# Patient Record
Sex: Female | Born: 1950
Health system: Southern US, Community
[De-identification: ages and names within clinical notes are randomized; demographics above are authoritative.]

## PROBLEM LIST (undated history)

## (undated) DIAGNOSIS — I471 Supraventricular tachycardia, unspecified: Secondary | ICD-10-CM

## (undated) DIAGNOSIS — I4891 Unspecified atrial fibrillation: Secondary | ICD-10-CM

## (undated) DIAGNOSIS — E785 Hyperlipidemia, unspecified: Secondary | ICD-10-CM

## (undated) DIAGNOSIS — T8859XA Other complications of anesthesia, initial encounter: Secondary | ICD-10-CM

## (undated) DIAGNOSIS — I499 Cardiac arrhythmia, unspecified: Secondary | ICD-10-CM

## (undated) DIAGNOSIS — R0789 Other chest pain: Secondary | ICD-10-CM

## (undated) DIAGNOSIS — D649 Anemia, unspecified: Secondary | ICD-10-CM

## (undated) DIAGNOSIS — G4730 Sleep apnea, unspecified: Secondary | ICD-10-CM

## (undated) DIAGNOSIS — I10 Essential (primary) hypertension: Secondary | ICD-10-CM

## (undated) DIAGNOSIS — K219 Gastro-esophageal reflux disease without esophagitis: Secondary | ICD-10-CM

## (undated) DIAGNOSIS — T4145XA Adverse effect of unspecified anesthetic, initial encounter: Secondary | ICD-10-CM

## (undated) DIAGNOSIS — R222 Localized swelling, mass and lump, trunk: Secondary | ICD-10-CM

## (undated) DIAGNOSIS — M1990 Unspecified osteoarthritis, unspecified site: Secondary | ICD-10-CM

## (undated) HISTORY — DX: Other chest pain: R07.89

## (undated) HISTORY — DX: Unspecified osteoarthritis, unspecified site: M19.90

## (undated) HISTORY — DX: Anemia, unspecified: D64.9

## (undated) HISTORY — DX: Localized swelling, mass and lump, trunk: R22.2

## (undated) HISTORY — PX: ABDOMINAL HYSTERECTOMY: SHX81

## (undated) HISTORY — DX: Hyperlipidemia, unspecified: E78.5

## (undated) HISTORY — DX: Supraventricular tachycardia, unspecified: I47.10

## (undated) HISTORY — PX: COLONOSCOPY: SHX174

## (undated) HISTORY — DX: Supraventricular tachycardia: I47.1

---

## 1988-05-25 HISTORY — PX: CHOLECYSTECTOMY: SHX55

## 1998-08-28 ENCOUNTER — Other Ambulatory Visit: Admission: RE | Admit: 1998-08-28 | Discharge: 1998-08-28 | Payer: Self-pay | Admitting: Obstetrics and Gynecology

## 2000-05-13 ENCOUNTER — Other Ambulatory Visit: Admission: RE | Admit: 2000-05-13 | Discharge: 2000-05-13 | Payer: Self-pay | Admitting: Obstetrics and Gynecology

## 2000-07-26 ENCOUNTER — Ambulatory Visit (HOSPITAL_BASED_OUTPATIENT_CLINIC_OR_DEPARTMENT_OTHER): Admission: RE | Admit: 2000-07-26 | Discharge: 2000-07-26 | Payer: Self-pay | Admitting: Obstetrics and Gynecology

## 2000-07-27 ENCOUNTER — Ambulatory Visit (HOSPITAL_BASED_OUTPATIENT_CLINIC_OR_DEPARTMENT_OTHER): Admission: RE | Admit: 2000-07-27 | Discharge: 2000-07-27 | Payer: Self-pay | Admitting: Obstetrics and Gynecology

## 2001-03-05 ENCOUNTER — Emergency Department (HOSPITAL_COMMUNITY): Admission: EM | Admit: 2001-03-05 | Discharge: 2001-03-05 | Payer: Self-pay | Admitting: Emergency Medicine

## 2001-05-31 ENCOUNTER — Emergency Department (HOSPITAL_COMMUNITY): Admission: EM | Admit: 2001-05-31 | Discharge: 2001-05-31 | Payer: Self-pay | Admitting: Emergency Medicine

## 2001-05-31 ENCOUNTER — Encounter: Payer: Self-pay | Admitting: Emergency Medicine

## 2001-09-06 ENCOUNTER — Other Ambulatory Visit: Admission: RE | Admit: 2001-09-06 | Discharge: 2001-09-06 | Payer: Self-pay | Admitting: Obstetrics and Gynecology

## 2003-03-06 ENCOUNTER — Other Ambulatory Visit: Admission: RE | Admit: 2003-03-06 | Discharge: 2003-03-06 | Payer: Self-pay | Admitting: Obstetrics and Gynecology

## 2003-05-30 ENCOUNTER — Emergency Department (HOSPITAL_COMMUNITY): Admission: EM | Admit: 2003-05-30 | Discharge: 2003-05-31 | Payer: Self-pay | Admitting: Emergency Medicine

## 2003-05-31 ENCOUNTER — Ambulatory Visit (HOSPITAL_COMMUNITY): Admission: RE | Admit: 2003-05-31 | Discharge: 2003-05-31 | Payer: Self-pay | Admitting: Pulmonary Disease

## 2003-06-04 ENCOUNTER — Ambulatory Visit (HOSPITAL_COMMUNITY): Admission: RE | Admit: 2003-06-04 | Discharge: 2003-06-04 | Payer: Self-pay | Admitting: *Deleted

## 2003-06-09 ENCOUNTER — Emergency Department (HOSPITAL_COMMUNITY): Admission: EM | Admit: 2003-06-09 | Discharge: 2003-06-10 | Payer: Self-pay | Admitting: Internal Medicine

## 2004-02-17 ENCOUNTER — Ambulatory Visit: Admission: RE | Admit: 2004-02-17 | Discharge: 2004-02-17 | Payer: Self-pay | Admitting: Pulmonary Disease

## 2004-02-25 ENCOUNTER — Ambulatory Visit (HOSPITAL_COMMUNITY): Admission: RE | Admit: 2004-02-25 | Discharge: 2004-02-25 | Payer: Self-pay | Admitting: Pulmonary Disease

## 2004-04-08 ENCOUNTER — Other Ambulatory Visit: Admission: RE | Admit: 2004-04-08 | Discharge: 2004-04-08 | Payer: Self-pay | Admitting: Obstetrics and Gynecology

## 2004-08-28 ENCOUNTER — Ambulatory Visit (HOSPITAL_COMMUNITY): Admission: RE | Admit: 2004-08-28 | Discharge: 2004-08-28 | Payer: Self-pay | Admitting: Pulmonary Disease

## 2005-05-04 ENCOUNTER — Other Ambulatory Visit: Admission: RE | Admit: 2005-05-04 | Discharge: 2005-05-04 | Payer: Self-pay | Admitting: Obstetrics and Gynecology

## 2005-05-05 ENCOUNTER — Encounter: Admission: RE | Admit: 2005-05-05 | Discharge: 2005-08-03 | Payer: Self-pay | Admitting: Pulmonary Disease

## 2005-07-24 ENCOUNTER — Encounter: Admission: RE | Admit: 2005-07-24 | Discharge: 2005-07-24 | Payer: Self-pay | Admitting: Obstetrics and Gynecology

## 2005-08-06 ENCOUNTER — Encounter: Admission: RE | Admit: 2005-08-06 | Discharge: 2005-11-04 | Payer: Self-pay | Admitting: Physician Assistant

## 2006-05-16 ENCOUNTER — Emergency Department (HOSPITAL_COMMUNITY): Admission: EM | Admit: 2006-05-16 | Discharge: 2006-05-16 | Payer: Self-pay | Admitting: Emergency Medicine

## 2007-07-27 ENCOUNTER — Encounter: Admission: RE | Admit: 2007-07-27 | Discharge: 2007-07-27 | Payer: Self-pay | Admitting: Obstetrics and Gynecology

## 2008-07-30 ENCOUNTER — Encounter: Admission: RE | Admit: 2008-07-30 | Discharge: 2008-07-30 | Payer: Self-pay | Admitting: Obstetrics and Gynecology

## 2010-06-15 ENCOUNTER — Encounter: Payer: Self-pay | Admitting: Obstetrics and Gynecology

## 2010-06-30 ENCOUNTER — Encounter: Payer: Self-pay | Admitting: Cardiology

## 2010-06-30 ENCOUNTER — Ambulatory Visit (INDEPENDENT_AMBULATORY_CARE_PROVIDER_SITE_OTHER): Payer: BC Managed Care – PPO | Admitting: Cardiology

## 2010-06-30 DIAGNOSIS — R079 Chest pain, unspecified: Secondary | ICD-10-CM | POA: Insufficient documentation

## 2010-06-30 DIAGNOSIS — E785 Hyperlipidemia, unspecified: Secondary | ICD-10-CM | POA: Insufficient documentation

## 2010-06-30 DIAGNOSIS — J45909 Unspecified asthma, uncomplicated: Secondary | ICD-10-CM | POA: Insufficient documentation

## 2010-06-30 DIAGNOSIS — M1990 Unspecified osteoarthritis, unspecified site: Secondary | ICD-10-CM | POA: Insufficient documentation

## 2010-06-30 LAB — CONVERTED CEMR LAB
ALT: 12 units/L
AST: 13 units/L
Chloride: 109 meq/L
HCT: 43.4 %
Hemoglobin: 14.4 g/dL
Potassium: 3.2 meq/L
Total Protein: 6.5 g/dL

## 2010-07-02 ENCOUNTER — Encounter: Payer: Self-pay | Admitting: Cardiology

## 2010-07-02 LAB — CONVERTED CEMR LAB
AST: 13 units/L (ref 0–37)
BUN: 9 mg/dL (ref 6–23)
Basophils Relative: 1 % (ref 0–1)
Calcium: 9.3 mg/dL (ref 8.4–10.5)
Chloride: 105 meq/L (ref 96–112)
Cholesterol: 211 mg/dL — ABNORMAL HIGH (ref 0–200)
Creatinine, Ser: 0.58 mg/dL (ref 0.40–1.20)
Glucose, Bld: 94 mg/dL (ref 70–99)
HDL: 52 mg/dL (ref >39)
Hemoglobin: 13.2 g/dL (ref 12.0–15.0)
Lymphs Abs: 1.4 10*3/uL (ref 0.7–4.0)
MCHC: 33.8 g/dL (ref 30.0–36.0)
MCV: 93.8 fL (ref 78.0–100.0)
Monocytes Absolute: 0.3 10*3/uL (ref 0.1–1.0)
Monocytes Relative: 8 % (ref 3–12)
Neutro Abs: 2.1 10*3/uL (ref 1.7–7.7)
RBC: 4.17 M/uL (ref 3.87–5.11)
Total CHOL/HDL Ratio: 4.1
Triglycerides: 101 mg/dL (ref <150)

## 2010-07-10 ENCOUNTER — Other Ambulatory Visit (HOSPITAL_COMMUNITY): Payer: BC Managed Care – PPO

## 2010-07-10 NOTE — Letter (Signed)
Summary: Stress Echocardiogram Information Sheet  [facility] at [facility]  [address], [place] [address]   Phone: [phone]  Fax: [phone]      June 30, 2010 MRN: [id number] light prior to the test.   Ana English  Doctor: Appointment Date: Appointment Time: Appointment Location: [facility]  Stress Echocardiogram Information Sheet    Instructions:   1. DO NOT  take your _________ medicine _______ days before the test.  2. Do not Eat or drink after midnight the night before test.  3. Dress prepared to exercise.  4. DO NOT use ANY caffine or tobacco products 3 hours before appointment.  5. Report to the Short Stay Center on the1st floor.  6. Please bring all current prescription medications.  7. If you have any questions, please call [phone]

## 2010-07-10 NOTE — Assessment & Plan Note (Signed)
Summary: **REFERRED FROM DR. HAWKINS ON 2/2 W/CHEST PAIN RESOLVED W/ N...   Visit Type:  Initial Consult Primary Provider:  Dr. Kari Baars   History of Present Illness: Ana English is seen at the kind request of Dr. Juanetta English for evaluation of chest discomfort.  I saw this nice woman 7 years ago for similar symptoms, in that case associated with PSVT.  Stress testing and echocardiography revealed no significant structural heart disease.  She has had no documented recurrent arrhythmias on treatment with a calcium channel antagonist nor has she had any palpitations to suggest tachycardia.  She leads a fairly sedentary lifestyle but denies orthopnea, PND, pedal edema, lightheadedness, syncope,  or dyspnea.    She was awakened from sleep approximately 2 weeks ago with fairly severe upper substernal discomfort.  She characterizes this as like a mass retrosternally.  She noted some mild associated nausea without emesis.  She had no diaphoresis nor dyspnea.  She drank some liquid and consumed a modest quantity of solid food and antacids without relief.  She ultimately took nitroglycerin that had not been prescribed for her with rapid relief.  She decided not to present to the emergency department, but saw her primary care physician a few days later by which time all of her symptoms had resolved.   Current Medications (verified): 1)  Maalox Regular Strength 225-200-25 Mg/64ml Susp (Alum & Mag Hydroxide-Simeth) .... As Needed 2)  Tums 500 Mg Chew (Calcium Carbonate Antacid) .... As Needed 3)  Verapamil Hcl Cr 180 Mg Xr24h-Cap (Verapamil Hcl) .... Patient Takes 1 Tab Daily 4)  Verapamil Hcl Cr 120 Mg Cr-Tabs (Verapamil Hcl) .... Take 1 Tab Daily 5)  Aspirin 81 Mg Tbec (Aspirin) .... Take 1 Tab Daily 6)  Diovan 160 Mg Tabs (Valsartan) .... Take 1 Tab Daily 7)  Vitamin B-12 1000 Mcg Tabs (Cyanocobalamin) .... Take 1 Tab Daily 8)  Nitrostat 0.4 Mg Subl (Nitroglycerin) .Marland Kitchen.. 1 Tablet Under Tongue At  Onset of Chest Pain; You May Repeat Every 5 Minutes For Up To 3 Doses.  Allergies (verified): No Known Drug Allergies  Past History:  Family History: Last updated: 07/05/2010 Father died as a result of complications related to rheumatoid arthritis Mother is alive and well at age 31 as is patient's brother.  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  Alcohol Use - no Regular Exercise - no Drug Use - no  Past Medical History: Chest discomfort PSVT Hypertension Hyperlipidemia Asthma Degenerative joint disease  Past Surgical History: Cholecystectomy-1990 C-Section x3 Colonoscopy-remote  Family History: Father died as a result of complications related to rheumatoid arthritis Mother is alive and well at age 19 as is patient's brother.  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  Alcohol Use - no Regular Exercise - no Drug Use - no  Review of Systems       Requires corrective lenses; partial upper and lower dentures; occasional brief palpitations; history of peptic ulcer disease.  All other systems reviewed and are negative.  Vital Signs:  Patient profile:   60 year old female Height:      66 inches Weight:      271 pounds BMI:     43.90 O2 Sat:      97 % Pulse rate:   75 / minute BP sitting:   131 / 86  (left arm)  Vitals Entered By: Babette Relic  Allyne Gee RN (June 30, 2010 1:19 PM)  Physical Exam  General:  Obese; well-developed; no acute distress: HEENT-[place]/AT; PERRL; EOM intact; conjunctiva and lids nl:  Neck-No JVD; no carotid bruits: Endocrine-No thyromegaly: Lungs-No tachypnea, clear without rales, rhonchi or wheezes: CV-normal PMI; normal S1 and S2; modest systolic ejection murmur +fourth heart sound Abdomen-BS normal; soft and non-tender without masses or organomegaly: MS-No deformities,  cyanosis or clubbing: Neurologic-Nl cranial nerves; symmetric strength and tone: Skin- Warm, no sig. lesions: Extremities-Nl distal pulses; no edema    Impression & Recommendations:  Problem # 1:  CHEST PAIN (ICD-786.50) Chest pain is atypical in terms of its onset at rest, lack of radiation and lack of typical associated symptoms; however, the quality of her discomfort is worrisome for possible myocardial ischemia.  Accordingly, a stress echocardiogram will be performed in an attempt to exclude this possibility.  Problem # 2:  ATRIAL FIBRILLATION-PAROXYSMAL (ICD-427.31) The exact description of her previous arrhythmia is uncertain.  We continue to seek records from [facility] and our own previous chart.  It is unlikely that she would not have had a recurrence of atrial fibrillation over so many years.  It also seems unlikely that her initial arrhythmia, were it tol recur would cause the type of chest discomfort she was experiencing without palpitations and would resolve immediately after use of nitroglycerin.  Nonetheless, that represents another, albeit somewhat remote, possible etiology.  Pain of GI origin is certainly within the differential as well.  Problem # 3:  HYPERLIPIDEMIA (ICD-272.4) A lipid profile will be assessed.  Ms. Hay has misplaced her prescription for simvastatin, but it is not yet clear that she requires pharmacologic therapy.  Problem # 4:  HYPERTENSION (ICD-401.1) Blood pressure appears to be controlled at this visit.  We will continue to monitor.  BP today: 131/86  Labs Reviewed: K+: 3.2 (06/30/2010) Creat: : 0.7 (06/30/2010)     Other Orders: T-Lipid Profile ([address]) T-Comprehensive Metabolic Panel (09811-91478) T-CBC w/Diff (29562-13086) T-TSH (57846-96295) Stress Echo (Stress Echo)  Patient Instructions: 1)  Your physician recommends that you schedule a follow-up appointment in: as needed  2)  Your physician recommends that you return for lab work  in: this week 3)  Your physician has recommended you make the following change in your medication: start taking Nitroglycerin 0.4 as needed for chest pain 4)  Your physician recommended you take 1 tablet (or 1 spray) under tongue at onset of chest pain; you may repeat every 5 minutes for up to 3 doses. If 3 or more doses are required, call 911 and proceed to the ER immediately. 5)  Your physician has requested that you have a stress echocardiogram. For further information please visit [email or web address]  Please follow instruction sheet as given. Prescriptions: NITROSTAT 0.4 MG SUBL (NITROGLYCERIN) 1 tablet under tongue at onset of chest pain; you may repeat every 5 minutes for up to 3 doses.  #25 x 3   Entered by:   Larita Fife Via LPN   Authorized by:   Kathlen Brunswick, MD, [facility]   Signed by:   Larita Fife Via LPN on 28/41/3244   Method used:   Electronically to        [facility]* (retail)       [address]       [place], [place]  [address]       Ph: [phone]       Fax: [phone]   RxID:   [id number]

## 2010-07-15 ENCOUNTER — Ambulatory Visit (HOSPITAL_COMMUNITY): Payer: BC Managed Care – PPO | Attending: Cardiology

## 2010-07-15 ENCOUNTER — Encounter: Payer: Self-pay | Admitting: Cardiology

## 2010-07-15 ENCOUNTER — Ambulatory Visit (HOSPITAL_COMMUNITY)
Admission: RE | Admit: 2010-07-15 | Discharge: 2010-07-15 | Disposition: A | Discharge status: Home / Self Care | Payer: BC Managed Care – PPO | Source: Ambulatory Visit | Attending: Cardiology | Admitting: Cardiology

## 2010-07-15 DIAGNOSIS — R079 Chest pain, unspecified: Secondary | ICD-10-CM | POA: Insufficient documentation

## 2010-07-15 DIAGNOSIS — R072 Precordial pain: Secondary | ICD-10-CM

## 2010-07-18 ENCOUNTER — Telehealth (INDEPENDENT_AMBULATORY_CARE_PROVIDER_SITE_OTHER): Payer: Self-pay

## 2010-07-31 NOTE — Progress Notes (Signed)
**Note De-Identified Sabre Romberger Obfuscation** Summary: REsults  Phone Note Call from Patient Call back at [phone]   Reason for Call: Lab or Test Results Summary of Call: pt would like results of lab results and stress test / tg Initial call taken by: Raechel Ache [facility],  July 18, 2010 12:01 PM  Follow-up for Phone Call        [facility] Follow-up by: Larita Fife Aika Brzoska LPN,  July 18, 2010 4:06 PM  Additional Follow-up for Phone Call Additional follow up Details #1::        Pt. given Echo and lab results, she expressed verbal understanding. Additional Follow-up by: Larita Fife Avilyn Virtue LPN,  July 21, 2010 8:39 AM

## 2010-10-10 NOTE — Procedures (Signed)
NAME:  Ana English, Ana English                          ACCOUNT NO.:  [id number]   MEDICAL RECORD NO.:  [id number]                   PATIENT TYPE:  OUT   LOCATION:  RAD                                  FACILITY:  [facility]   PHYSICIAN:  [place] Bing, M.D.               DATE OF BIRTH:  [birth year]   DATE OF PROCEDURE:  DATE OF DISCHARGE:                                  ECHOCARDIOGRAM   REFERRING:  1. Edward L. Juanetta Gosling, M.D.  2. [place] Bing, M.D.   CLINICAL DATA:  A 60 year old woman with chest pain, dyspnea and  hypertension.   M-MODE:  1. Aorta 3.0.  2. Left atrium 3.4.  3. Septum 1.6.  4. Posterior wall 1.3.  5. LV diastole 3.9.  6. LV systole 2.4.   FINDINGS:  1. Technically-adequate echocardiographic study.  2. Normal left atrium, right atrium and right ventricle.  3. Normal mitral, aortic, pulmonic and tricuspid valves.  4. Normal internal dimension of the left ventricle; mild to moderate LVH.  5. Normal regional and global LV systolic function.  6. Normal IVC.      ___________________________________________                                            [place] Bing, M.D.   RR/MEDQ  D:  05/31/2003  T:  05/31/2003  Job:  [id number]

## 2010-10-10 NOTE — Procedures (Signed)
NAME:  Ana English, Ana English                          ACCOUNT NO.:  [id number]   MEDICAL RECORD NO.:  [id number]                   PATIENT TYPE:  OUT   LOCATION:  RAD                                  FACILITY:  [facility]   PHYSICIAN:  Vida Roller, M.D.                DATE OF BIRTH:  14-Jul-1950   DATE OF PROCEDURE:  DATE OF DISCHARGE:                                    STRESS TEST   INDICATION:  Ms. Ana English is a 60 year old female with history of paroxysmal  atrial fibrillation status post cardioversion, she has no known coronary  artery disease, she came to the emergency room on May 30, 2003 with  complaints of chest pain, shortness of breath, palpitations and found to  paroxysmal supraventricular tachycardia which was treated with Adenocard and  diltiazem IV and resolved.  She was subsequently seen in consultation by Dr.  [place] Bing on June 01, 2003.  Cardiac enzymes were drawn which were  all within normal limits.   BASELINE DATA:  EKG revealed sinus rhythm at 63 beats per minute with  nonspecific ST abnormalities, blood pressure is 148/90.   ADENOSINE-CARDIOLITE:  Sixty milligrams of Adenosine was infused over 4-  minute protocol with Cardiolite injected at 3 minutes.  Patient reported  shortness of breath/chest tightness which resolved in recovery.  EKG  revealed no arrhythmias, she did have some minimal ST depression and T wave  inversion in the inferolateral leads which resolved in recovery.  Maximum  heart rate was 108 beats per minute.  Maximum blood pressure is 160/88.   FINAL IMAGES AND RESULTS:  Pending MD review.     ________________________________________  ___________________________________________  Jae Dire, P.A. [facility]                      Vida Roller, M.D.   AB/MEDQ  D:  06/04/2003  T:  06/04/2003  Job:  [id number]

## 2010-10-10 NOTE — Procedures (Signed)
Ana English, Ana English NO.:  [id number]   MEDICAL RECORD NO.:  [id number]          PATIENT TYPE:  OUT   LOCATION:  SLEEP LAB                     FACILITY:  [facility]   PHYSICIAN:  Marcelyn Bruins, M.D. [facility] DATE OF BIRTH:  08-09-1950   DATE OF STUDY:  02/17/2004                              NOCTURNAL POLYSOMNOGRAM   REFERRING PHYSICIAN:  Dr. Kari Baars.   INDICATIONS FOR THE STUDY:  Hypersomnia with sleep apnea.  Epworth Score is  7.   SLEEP ARCHITECTURE:  The patient had a total sleep time of 318 minutes with  a sleep efficiency of 76%.  There was mildly decreased REM but adequate slow  wave sleep.  Sleep onset latency was mildly prolonged at 46 minutes, and REM  latency was normal.   IMPRESSION:  1.  Mild obstructive sleep apnea/hypopnea syndrome with a respiratory      disturbance index of 14 events per hour and oxygen desaturation as low      as 87%.  Events were not positional and were only associated with mild      snoring.  The patient did not meet split night protocol due to most of      the events occurring in the latter half of the night.  2.  No clinically-significant cardiac arrhythmias.  3.  Very small numbers of leg jerks with what appears to be very little      sleep disruption.      KC/MEDQ  D:  03/14/2004 17:08:13  T:  03/14/2004 18:03:23  Job:  [id number]

## 2011-03-11 ENCOUNTER — Other Ambulatory Visit (HOSPITAL_COMMUNITY): Payer: Self-pay | Admitting: Pulmonary Disease

## 2011-03-11 ENCOUNTER — Ambulatory Visit (HOSPITAL_COMMUNITY)
Admission: RE | Admit: 2011-03-11 | Discharge: 2011-03-11 | Disposition: A | Discharge status: Home / Self Care | Payer: BC Managed Care – PPO | Source: Ambulatory Visit | Attending: Pulmonary Disease | Admitting: Pulmonary Disease

## 2011-03-11 DIAGNOSIS — M25579 Pain in unspecified ankle and joints of unspecified foot: Secondary | ICD-10-CM

## 2011-03-11 DIAGNOSIS — R937 Abnormal findings on diagnostic imaging of other parts of musculoskeletal system: Secondary | ICD-10-CM | POA: Insufficient documentation

## 2011-03-18 ENCOUNTER — Other Ambulatory Visit (HOSPITAL_COMMUNITY): Payer: Self-pay | Admitting: Pulmonary Disease

## 2011-03-18 DIAGNOSIS — M8430XA Stress fracture, unspecified site, initial encounter for fracture: Secondary | ICD-10-CM

## 2011-03-19 ENCOUNTER — Encounter (HOSPITAL_COMMUNITY): Payer: BC Managed Care – PPO

## 2011-03-19 ENCOUNTER — Other Ambulatory Visit (HOSPITAL_COMMUNITY): Payer: BC Managed Care – PPO

## 2011-03-20 ENCOUNTER — Encounter (HOSPITAL_COMMUNITY)
Admission: RE | Admit: 2011-03-20 | Discharge: 2011-03-20 | Disposition: A | Discharge status: Home / Self Care | Payer: BC Managed Care – PPO | Source: Ambulatory Visit | Attending: Pulmonary Disease | Admitting: Pulmonary Disease

## 2011-03-20 ENCOUNTER — Encounter (HOSPITAL_COMMUNITY): Payer: Self-pay

## 2011-03-20 DIAGNOSIS — R937 Abnormal findings on diagnostic imaging of other parts of musculoskeletal system: Secondary | ICD-10-CM | POA: Insufficient documentation

## 2011-03-20 DIAGNOSIS — M8430XA Stress fracture, unspecified site, initial encounter for fracture: Secondary | ICD-10-CM

## 2011-03-20 DIAGNOSIS — M25579 Pain in unspecified ankle and joints of unspecified foot: Secondary | ICD-10-CM | POA: Insufficient documentation

## 2011-03-20 HISTORY — DX: Essential (primary) hypertension: I10

## 2011-03-20 MED ORDER — TECHNETIUM TC 99M MEDRONATE IV KIT
25.0000 | PACK | Freq: Once | INTRAVENOUS | Status: AC | PRN
  Administered 2011-03-20: 25 via INTRAVENOUS

## 2011-05-06 ENCOUNTER — Encounter: Payer: Self-pay | Admitting: Cardiology

## 2011-07-21 ENCOUNTER — Other Ambulatory Visit: Payer: Self-pay | Admitting: Obstetrics and Gynecology

## 2011-07-21 DIAGNOSIS — Z1231 Encounter for screening mammogram for malignant neoplasm of breast: Secondary | ICD-10-CM

## 2011-08-05 ENCOUNTER — Ambulatory Visit: Payer: BC Managed Care – PPO

## 2011-08-06 ENCOUNTER — Ambulatory Visit
Admission: RE | Admit: 2011-08-06 | Discharge: 2011-08-06 | Disposition: A | Discharge status: Home / Self Care | Payer: BC Managed Care – PPO | Source: Ambulatory Visit | Attending: Obstetrics and Gynecology | Admitting: Obstetrics and Gynecology

## 2011-08-06 DIAGNOSIS — Z1231 Encounter for screening mammogram for malignant neoplasm of breast: Secondary | ICD-10-CM

## 2011-09-07 ENCOUNTER — Encounter (INDEPENDENT_AMBULATORY_CARE_PROVIDER_SITE_OTHER): Payer: Self-pay | Admitting: Surgery

## 2011-09-23 ENCOUNTER — Encounter (INDEPENDENT_AMBULATORY_CARE_PROVIDER_SITE_OTHER): Payer: Self-pay | Admitting: Surgery

## 2011-09-23 ENCOUNTER — Ambulatory Visit (INDEPENDENT_AMBULATORY_CARE_PROVIDER_SITE_OTHER): Payer: BC Managed Care – PPO | Admitting: Surgery

## 2011-09-23 VITALS — BP 152/93 | HR 74 | Temp 97.3°F | Resp 16 | Ht 66.0 in | Wt 261.1 lb

## 2011-09-23 DIAGNOSIS — D1779 Benign lipomatous neoplasm of other sites: Secondary | ICD-10-CM

## 2011-09-23 DIAGNOSIS — D171 Benign lipomatous neoplasm of skin and subcutaneous tissue of trunk: Secondary | ICD-10-CM | POA: Insufficient documentation

## 2011-09-23 NOTE — Progress Notes (Signed)
Patient ID: Ana English, female   DOB: 1950-10-18, 61 y.o.   MRN: [id number]  Chief Complaint  Patient presents with  . Mass    On Back    HPI Ana English is a 61 y.o. female. Referred by Dr. Candice Camp for evaluation of left back mass HPI 62 yo female presents with a 7 month history of a palpable mass in her left back.  Occasionally it has become tender.  Her husband was recently diagnosed with Stage IV lymphoma, so she is paying more attention to any palpable masses on her body.  She presents now for discussion of the mass.  Past Medical History  Diagnosis Date  . Hypertension   . Asthma   . Chest discomfort   . PSVT (paroxysmal supraventricular tachycardia)   . Hyperlipidemia   . DJD (degenerative joint disease)   . Anemia   . Mass on back   Cardiologist - [facility]  Past Surgical History  Procedure Date  . Colonoscopy     Remote  . Cholecystectomy 1990  . Cesarean section over 30 years ago    x3    Family History  Problem Relation Age of Onset  . Arthritis Father     Social History History  Substance Use Topics  . Smoking status: Never Smoker   . Smokeless tobacco: Never Used  . Alcohol Use: No    No Known Allergies  Current Outpatient Prescriptions  Medication Sig Dispense Refill  . aluminum & magnesium hydroxide (MAALOX) 225-200 MG/5ML suspension Take by mouth as needed.        Marland Kitchen aspirin 81 MG tablet Take 81 mg by mouth daily.        . calcium carbonate (TUMS - DOSED IN MG ELEMENTAL CALCIUM) 500 MG chewable tablet Chew 1 tablet by mouth daily.        . nitroGLYCERIN (NITROSTAT) 0.4 MG SL tablet Place 0.4 mg under the tongue every 5 (five) minutes as needed. May repeat for up to 3 doses.       . verapamil (CALAN) 120 MG tablet Take 120 mg by mouth daily.        . verapamil (COVERA HS) 180 MG (CO) 24 hr tablet Take 180 mg by mouth at bedtime.        . vitamin B-12 (CYANOCOBALAMIN) 1000 MCG tablet Take 1,000 mcg by mouth daily.          Review of  Systems Review of Systems  Constitutional: Negative for fever, chills and unexpected weight change.  HENT: Negative for hearing loss, congestion, sore throat, trouble swallowing and voice change.   Eyes: Negative for visual disturbance.  Respiratory: Negative for cough and wheezing.   Cardiovascular: Negative for chest pain, palpitations and leg swelling.  Gastrointestinal: Negative for nausea, vomiting, abdominal pain, diarrhea, constipation, blood in stool, abdominal distention and anal bleeding.  Genitourinary: Negative for hematuria, vaginal bleeding and difficulty urinating.  Musculoskeletal: Negative for arthralgias.  Skin: Negative for rash and wound.  Neurological: Negative for seizures, syncope and headaches.  Hematological: Negative for adenopathy. Does not bruise/bleed easily.  Psychiatric/Behavioral: Negative for confusion.    Blood pressure 152/93, pulse 74, temperature 97.3 F (36.3 C), temperature source Temporal, resp. rate 16, height 5\' 6"  (1.676 m), weight 261 lb 2 oz (118.446 kg).  Physical Exam Physical Exam WDWN in NAD HEENT:  EOMI, sclera anicteric Neck:  No masses, no thyromegaly Lungs:  CTA bilaterally; normal respiratory effort CV:  Regular rate and rhythm; no murmurs Abd:  +  bowel sounds, soft, non-tender, no masses Ext:  Well-perfused; no edema Back - left low thoracic back - 8 x 6 cm subcutaneous mass, easily palpable; no overlying skin changes Skin:  Warm, dry; no sign of jaundice  Data Reviewed none  Assessment    Subcutaneous lipoma - left back 8 x 6 cm    Plan    Excision of subcutaneous lipoma - left back.  The surgical procedure has been discussed with the patient.  Potential risks, benefits, alternative treatments, and expected outcomes have been explained.  All of the patient's questions at this time have been answered.  The likelihood of reaching the patient's treatment goal is good.  The patient understand the proposed surgical procedure  and wishes to proceed. The patient will call back to schedule the case.  Wilmon Arms. Corliss Skains, MD, [facility]  09/23/2011 12:16 PM        Takeisha Cianci K. 09/23/2011, 12:11 PM

## 2011-09-23 NOTE — Patient Instructions (Signed)
Call our surgery schedulers at [phone] when you are ready to schedule this surgery.

## 2012-02-02 ENCOUNTER — Other Ambulatory Visit (HOSPITAL_COMMUNITY): Payer: Self-pay | Admitting: Pulmonary Disease

## 2012-02-02 ENCOUNTER — Ambulatory Visit (HOSPITAL_COMMUNITY)
Admission: RE | Admit: 2012-02-02 | Discharge: 2012-02-02 | Disposition: A | Discharge status: Home / Self Care | Payer: BC Managed Care – PPO | Source: Ambulatory Visit | Attending: Pulmonary Disease | Admitting: Pulmonary Disease

## 2012-02-02 DIAGNOSIS — M25562 Pain in left knee: Secondary | ICD-10-CM

## 2012-02-02 DIAGNOSIS — M25569 Pain in unspecified knee: Secondary | ICD-10-CM | POA: Insufficient documentation

## 2012-02-02 DIAGNOSIS — M25522 Pain in left elbow: Secondary | ICD-10-CM

## 2012-02-02 DIAGNOSIS — M25529 Pain in unspecified elbow: Secondary | ICD-10-CM | POA: Insufficient documentation

## 2012-09-12 ENCOUNTER — Other Ambulatory Visit: Payer: Self-pay

## 2012-09-12 DIAGNOSIS — Z1231 Encounter for screening mammogram for malignant neoplasm of breast: Secondary | ICD-10-CM

## 2012-10-11 ENCOUNTER — Ambulatory Visit
Admission: RE | Admit: 2012-10-11 | Discharge: 2012-10-11 | Disposition: A | Discharge status: Home / Self Care | Payer: BC Managed Care – PPO | Source: Ambulatory Visit

## 2012-10-11 DIAGNOSIS — Z1231 Encounter for screening mammogram for malignant neoplasm of breast: Secondary | ICD-10-CM

## 2013-02-22 ENCOUNTER — Other Ambulatory Visit (HOSPITAL_COMMUNITY): Payer: Self-pay | Admitting: Pulmonary Disease

## 2013-02-22 DIAGNOSIS — D171 Benign lipomatous neoplasm of skin and subcutaneous tissue of trunk: Secondary | ICD-10-CM

## 2013-02-22 DIAGNOSIS — R109 Unspecified abdominal pain: Secondary | ICD-10-CM

## 2013-02-23 ENCOUNTER — Encounter (HOSPITAL_COMMUNITY): Payer: Self-pay

## 2013-02-23 ENCOUNTER — Ambulatory Visit (HOSPITAL_COMMUNITY)
Admission: RE | Admit: 2013-02-23 | Discharge: 2013-02-23 | Disposition: A | Discharge status: Home / Self Care | Payer: BC Managed Care – PPO | Source: Ambulatory Visit | Attending: Pulmonary Disease | Admitting: Pulmonary Disease

## 2013-02-23 DIAGNOSIS — D1779 Benign lipomatous neoplasm of other sites: Secondary | ICD-10-CM | POA: Insufficient documentation

## 2013-02-23 DIAGNOSIS — M5380 Other specified dorsopathies, site unspecified: Secondary | ICD-10-CM | POA: Insufficient documentation

## 2013-02-23 DIAGNOSIS — R1032 Left lower quadrant pain: Secondary | ICD-10-CM | POA: Insufficient documentation

## 2013-02-23 DIAGNOSIS — R109 Unspecified abdominal pain: Secondary | ICD-10-CM

## 2013-02-23 DIAGNOSIS — D171 Benign lipomatous neoplasm of skin and subcutaneous tissue of trunk: Secondary | ICD-10-CM

## 2013-09-18 ENCOUNTER — Other Ambulatory Visit: Payer: Self-pay

## 2013-09-18 DIAGNOSIS — Z1231 Encounter for screening mammogram for malignant neoplasm of breast: Secondary | ICD-10-CM

## 2013-10-12 ENCOUNTER — Ambulatory Visit
Admission: RE | Admit: 2013-10-12 | Discharge: 2013-10-12 | Disposition: A | Discharge status: Home / Self Care | Source: Ambulatory Visit

## 2013-10-12 ENCOUNTER — Encounter (INDEPENDENT_AMBULATORY_CARE_PROVIDER_SITE_OTHER): Payer: Self-pay

## 2013-10-12 DIAGNOSIS — Z1231 Encounter for screening mammogram for malignant neoplasm of breast: Secondary | ICD-10-CM

## 2014-05-29 ENCOUNTER — Other Ambulatory Visit (HOSPITAL_COMMUNITY): Payer: Self-pay | Admitting: Pulmonary Disease

## 2014-05-29 ENCOUNTER — Ambulatory Visit (HOSPITAL_COMMUNITY)
Admission: RE | Admit: 2014-05-29 | Discharge: 2014-05-29 | Disposition: A | Discharge status: Home / Self Care | Source: Ambulatory Visit | Attending: Pulmonary Disease | Admitting: Pulmonary Disease

## 2014-05-29 DIAGNOSIS — R0602 Shortness of breath: Secondary | ICD-10-CM | POA: Insufficient documentation

## 2014-12-24 ENCOUNTER — Other Ambulatory Visit: Payer: Self-pay

## 2014-12-24 DIAGNOSIS — Z1231 Encounter for screening mammogram for malignant neoplasm of breast: Secondary | ICD-10-CM

## 2015-01-08 ENCOUNTER — Encounter (HOSPITAL_COMMUNITY): Payer: Self-pay

## 2015-01-08 ENCOUNTER — Encounter (HOSPITAL_COMMUNITY)
Admission: RE | Admit: 2015-01-08 | Discharge: 2015-01-08 | Disposition: A | Discharge status: Home / Self Care | Source: Ambulatory Visit | Attending: Obstetrics and Gynecology | Admitting: Obstetrics and Gynecology

## 2015-01-08 DIAGNOSIS — J45909 Unspecified asthma, uncomplicated: Secondary | ICD-10-CM | POA: Diagnosis not present

## 2015-01-08 DIAGNOSIS — Z01818 Encounter for other preprocedural examination: Secondary | ICD-10-CM | POA: Insufficient documentation

## 2015-01-08 DIAGNOSIS — N950 Postmenopausal bleeding: Secondary | ICD-10-CM | POA: Diagnosis present

## 2015-01-08 DIAGNOSIS — N840 Polyp of corpus uteri: Secondary | ICD-10-CM | POA: Diagnosis not present

## 2015-01-08 DIAGNOSIS — Z79899 Other long term (current) drug therapy: Secondary | ICD-10-CM | POA: Diagnosis not present

## 2015-01-08 DIAGNOSIS — Z7902 Long term (current) use of antithrombotics/antiplatelets: Secondary | ICD-10-CM | POA: Diagnosis not present

## 2015-01-08 DIAGNOSIS — I4891 Unspecified atrial fibrillation: Secondary | ICD-10-CM | POA: Diagnosis not present

## 2015-01-08 DIAGNOSIS — D251 Intramural leiomyoma of uterus: Secondary | ICD-10-CM | POA: Diagnosis not present

## 2015-01-08 DIAGNOSIS — Z6841 Body Mass Index (BMI) 40.0 and over, adult: Secondary | ICD-10-CM | POA: Diagnosis not present

## 2015-01-08 DIAGNOSIS — K219 Gastro-esophageal reflux disease without esophagitis: Secondary | ICD-10-CM | POA: Diagnosis not present

## 2015-01-08 DIAGNOSIS — N939 Abnormal uterine and vaginal bleeding, unspecified: Secondary | ICD-10-CM | POA: Diagnosis not present

## 2015-01-08 DIAGNOSIS — I10 Essential (primary) hypertension: Secondary | ICD-10-CM | POA: Diagnosis not present

## 2015-01-08 DIAGNOSIS — M1990 Unspecified osteoarthritis, unspecified site: Secondary | ICD-10-CM | POA: Diagnosis not present

## 2015-01-08 HISTORY — DX: Sleep apnea, unspecified: G47.30

## 2015-01-08 HISTORY — DX: Cardiac arrhythmia, unspecified: I49.9

## 2015-01-08 HISTORY — DX: Other complications of anesthesia, initial encounter: T88.59XA

## 2015-01-08 HISTORY — DX: Gastro-esophageal reflux disease without esophagitis: K21.9

## 2015-01-08 HISTORY — DX: Adverse effect of unspecified anesthetic, initial encounter: T41.45XA

## 2015-01-08 LAB — CBC
HCT: 41.2 % (ref 36.0–46.0)
Hemoglobin: 13.7 g/dL (ref 12.0–15.0)
MCH: 31.6 pg (ref 26.0–34.0)
MCHC: 33.3 g/dL (ref 30.0–36.0)
MCV: 95.2 fL (ref 78.0–100.0)
PLATELETS: 193 10*3/uL (ref 150–400)
RBC: 4.33 MIL/uL (ref 3.87–5.11)
RDW: 13.3 % (ref 11.5–15.5)
WBC: 4.2 10*3/uL (ref 4.0–10.5)

## 2015-01-08 LAB — BASIC METABOLIC PANEL
ANION GAP: 5 (ref 5–15)
BUN: 12 mg/dL (ref 6–20)
CALCIUM: 9.3 mg/dL (ref 8.9–10.3)
CO2: 29 mmol/L (ref 22–32)
CREATININE: 0.74 mg/dL (ref 0.44–1.00)
Chloride: 106 mmol/L (ref 101–111)
Glucose, Bld: 89 mg/dL (ref 65–99)
Potassium: 3.7 mmol/L (ref 3.5–5.1)
SODIUM: 140 mmol/L (ref 135–145)

## 2015-01-08 LAB — ABO/RH: ABO/RH(D): O NEG

## 2015-01-08 LAB — TYPE AND SCREEN
ABO/RH(D): O NEG
ANTIBODY SCREEN: NEGATIVE

## 2015-01-08 NOTE — Patient Instructions (Addendum)
Your procedure is scheduled on:01/15/15  Enter through the Main Entrance at :6am Pick up desk phone and dial [phone] and inform us of your arrival.  Please call [phone] if you have any problems the morning of surgery.  Remember: Do not eat food or drink liquids, including water, after midnight:Monday   You may brush your teeth the morning of surgery.  Take these meds the morning of surgery with a sip of water:BP meds and Prilosec  DO NOT wear jewelry, eye make-up, lipstick,body lotion, or dark fingernail polish.  (Polished toes are ok) You may wear deodorant.  If you are to be admitted after surgery, leave suitcase in car until your room has been assigned. Patients discharged on the day of surgery will not be allowed to drive home. Wear loose fitting, comfortable clothes for your ride home.

## 2015-01-14 ENCOUNTER — Encounter (HOSPITAL_COMMUNITY): Payer: Self-pay | Admitting: Anesthesiology

## 2015-01-14 MED ORDER — DEXTROSE 5 % IV SOLN
2.0000 g | INTRAVENOUS | Status: AC
  Administered 2015-01-15: 2 g via INTRAVENOUS
  Filled 2015-01-14: qty 2

## 2015-01-14 NOTE — H&P (Addendum)
  Rasheida HILMER 52080.2 01/10/2015 S:  Kayanna is a 64 year old, G4P4, postmenopausal woman who has been having ongoing problems with postmenopausal abnormal bleeding.  She had an endometrial polyp recently and had hysteroscopy D&C for this, which was benign.  Then, she began spotting and has continued to have intermittent spotting and occasional very heavy bleeding with clotting and occasional pain from this.  Had a recent saline infusion ultrasound on 10/29/2014, which showed a 3.8 mm endometrial lining, no endometrial masses, no adnexal masses.  The uterus was normal in size, shape, and contour.  At this time, due to ongoing postmenopausal abnormal bleeding, she desires definitive surgical intervention requesting hysterectomy and presents for that.   PAST MEDICAL HISTORY:  Her past medical history is significant for history of atrial fibrillation.  She has been on Xarelto for this and currently she is not in Afib.  Hypertension, arthritis, gastroesophageal reflux, and asthma.   PAST SURGICAL HISTORY:  She has had 3 cesarean sections and a cholecystectomy.   DRUG ALLERGIES:  No known drug allergies.Marland Kitchen  MEDICATIONS:  Medications include verapamil, losartan, Xarelto, which she is currently not on.  Lodine, which she has stopped for the surgery.  Omeprazole, which she is continuing.  Xopenex, which is for her asthma. O:  Physical exam:  Blood pressure is 138/80.  Heart is regular rate and rhythm.  Lungs clear to auscultation bilaterally.  Abdomen is nondistended, nontender.  Well-healed Pfannenstiel incision.  Uterus is anteverted, mobile, nontender, 6 to 8 weeks in size.  No significant cystocele or rectocele.   A/P:  Abnormal uterine bleeding, postmenopausal bleeding, and pelvic pain.  Patient desires definitive surgical intervention.  Laparoscopically-assisted vaginal hysterectomy with bilateral salpingo-oophorectomy.  We discussed the risks and benefits, its pros and its cons at length.  Discussed possibility  of having to open her, give her blood transfusion.  She does give her informed consent and wishes to proceed.  All of her questions are answered. Louretta Shorten, MD/[id number]  01/15/15 0715 This patient has been seen and examined.   All of her questions were answered.  Labs and vital signs reviewed.  Informed consent has been obtained.  The History and Physical is current.This patient has been seen and examined.   All of her questions were answered.  Labs and vital signs reviewed.  Informed consent has been obtained.  The History and Physical is current. DL

## 2015-01-14 NOTE — Anesthesia Preprocedure Evaluation (Addendum)
Anesthesia Evaluation  Patient identified by MRN, date of birth, ID band Patient awake    Reviewed: Allergy & Precautions, NPO status , Patient's Chart, lab work & pertinent test results, reviewed documented beta blocker date and time   History of Anesthesia Complications (+) history of anesthetic complications  Airway Mallampati: II  TM Distance: >3 FB Neck ROM: Full    Dental no notable dental hx. (+) Lower Dentures, Upper Dentures   Pulmonary shortness of breath and with exertion, asthma , sleep apnea and Continuous Positive Airway Pressure Ventilation ,  breath sounds clear to auscultation  Pulmonary exam normal       Cardiovascular hypertension, Pt. on medications Normal cardiovascular exam+ dysrhythmias Atrial Fibrillation Rhythm:Regular Rate:Normal     Neuro/Psych negative neurological ROS  negative psych ROS   GI/Hepatic Neg liver ROS, GERD-  Medicated and Controlled,  Endo/Other  Morbid obesity  Renal/GU negative Renal ROS  negative genitourinary   Musculoskeletal  (+) Arthritis -,   Abdominal (+) + obese,   Peds  Hematology  (+) anemia ,   Anesthesia Other Findings   Reproductive/Obstetrics AUB PMB                            Anesthesia Physical Anesthesia Plan  ASA: III  Anesthesia Plan: General   Post-op Pain Management:    Induction: Intravenous  Airway Management Planned: Oral ETT  Additional Equipment:   Intra-op Plan:   Post-operative Plan: Extubation in OR  Informed Consent: I have reviewed the patients History and Physical, chart, labs and discussed the procedure including the risks, benefits and alternatives for the proposed anesthesia with the patient or authorized representative who has indicated his/her understanding and acceptance.   Dental advisory given  Plan Discussed with: CRNA, Anesthesiologist and Surgeon  Anesthesia Plan Comments:          Anesthesia Quick Evaluation

## 2015-01-15 ENCOUNTER — Observation Stay (HOSPITAL_COMMUNITY)
Admission: RE | Admit: 2015-01-15 | Discharge: 2015-01-16 | Disposition: A | Discharge status: Home / Self Care | Source: Ambulatory Visit | Attending: Obstetrics and Gynecology | Admitting: Obstetrics and Gynecology

## 2015-01-15 ENCOUNTER — Ambulatory Visit (HOSPITAL_COMMUNITY): Admitting: Anesthesiology

## 2015-01-15 ENCOUNTER — Encounter (HOSPITAL_COMMUNITY): Payer: Self-pay | Admitting: Anesthesiology

## 2015-01-15 ENCOUNTER — Encounter (HOSPITAL_COMMUNITY)
Admission: RE | Disposition: A | Discharge status: Home / Self Care | Payer: Self-pay | Source: Ambulatory Visit | Attending: Obstetrics and Gynecology

## 2015-01-15 DIAGNOSIS — Z79899 Other long term (current) drug therapy: Secondary | ICD-10-CM | POA: Insufficient documentation

## 2015-01-15 DIAGNOSIS — M1990 Unspecified osteoarthritis, unspecified site: Secondary | ICD-10-CM | POA: Insufficient documentation

## 2015-01-15 DIAGNOSIS — I4891 Unspecified atrial fibrillation: Secondary | ICD-10-CM | POA: Insufficient documentation

## 2015-01-15 DIAGNOSIS — Z6841 Body Mass Index (BMI) 40.0 and over, adult: Secondary | ICD-10-CM | POA: Insufficient documentation

## 2015-01-15 DIAGNOSIS — D251 Intramural leiomyoma of uterus: Secondary | ICD-10-CM | POA: Diagnosis not present

## 2015-01-15 DIAGNOSIS — Z7902 Long term (current) use of antithrombotics/antiplatelets: Secondary | ICD-10-CM | POA: Insufficient documentation

## 2015-01-15 DIAGNOSIS — J45909 Unspecified asthma, uncomplicated: Secondary | ICD-10-CM | POA: Insufficient documentation

## 2015-01-15 DIAGNOSIS — N840 Polyp of corpus uteri: Secondary | ICD-10-CM | POA: Insufficient documentation

## 2015-01-15 DIAGNOSIS — I10 Essential (primary) hypertension: Secondary | ICD-10-CM | POA: Insufficient documentation

## 2015-01-15 DIAGNOSIS — K219 Gastro-esophageal reflux disease without esophagitis: Secondary | ICD-10-CM | POA: Insufficient documentation

## 2015-01-15 DIAGNOSIS — Z90710 Acquired absence of both cervix and uterus: Secondary | ICD-10-CM | POA: Diagnosis present

## 2015-01-15 DIAGNOSIS — N950 Postmenopausal bleeding: Secondary | ICD-10-CM | POA: Insufficient documentation

## 2015-01-15 SURGERY — HYSTERECTOMY, VAGINAL, LAPAROSCOPY-ASSISTED, WITH SALPINGO-OOPHORECTOMY
Anesthesia: General | Site: Abdomen | Laterality: Bilateral

## 2015-01-15 MED ORDER — ONDANSETRON HCL 4 MG/2ML IJ SOLN
INTRAMUSCULAR | Status: DC | PRN
  Administered 2015-01-15: 4 mg via INTRAVENOUS

## 2015-01-15 MED ORDER — DEXAMETHASONE SODIUM PHOSPHATE 4 MG/ML IJ SOLN
INTRAMUSCULAR | Status: AC
  Filled 2015-01-15: qty 1

## 2015-01-15 MED ORDER — MENTHOL 3 MG MT LOZG
1.0000 | LOZENGE | OROMUCOSAL | Status: DC | PRN
  Administered 2015-01-15: 3 mg via ORAL
  Filled 2015-01-15: qty 9

## 2015-01-15 MED ORDER — BUPIVACAINE HCL (PF) 0.25 % IJ SOLN
INTRAMUSCULAR | Status: AC
  Filled 2015-01-15: qty 30

## 2015-01-15 MED ORDER — IRBESARTAN 150 MG PO TABS
150.0000 mg | ORAL_TABLET | Freq: Every day | ORAL | Status: DC
  Administered 2015-01-16: 150 mg via ORAL
  Filled 2015-01-15 (×2): qty 1

## 2015-01-15 MED ORDER — HYDROMORPHONE 0.3 MG/ML IV SOLN
INTRAVENOUS | Status: DC
  Administered 2015-01-15: 0.2 mg via INTRAVENOUS
  Administered 2015-01-15: 13:00:00 via INTRAVENOUS
  Filled 2015-01-15: qty 25

## 2015-01-15 MED ORDER — FLUMAZENIL 0.5 MG/5ML IV SOLN
INTRAVENOUS | Status: DC | PRN
  Administered 2015-01-15 (×2): 0.2 mg via INTRAVENOUS

## 2015-01-15 MED ORDER — DEXTROSE-NACL 5-0.45 % IV SOLN
INTRAVENOUS | Status: DC
  Administered 2015-01-15: 17:00:00 via INTRAVENOUS

## 2015-01-15 MED ORDER — DIPHENHYDRAMINE HCL 50 MG/ML IJ SOLN: 12.5000 mg | Freq: Four times a day (QID) | INTRAMUSCULAR | Status: DC | PRN

## 2015-01-15 MED ORDER — ROCURONIUM BROMIDE 100 MG/10ML IV SOLN
INTRAVENOUS | Status: DC | PRN
  Administered 2015-01-15: 50 mg via INTRAVENOUS

## 2015-01-15 MED ORDER — LEVALBUTEROL TARTRATE 45 MCG/ACT IN AERO: 2.0000 | INHALATION_SPRAY | RESPIRATORY_TRACT | Status: DC | PRN

## 2015-01-15 MED ORDER — DEXAMETHASONE SODIUM PHOSPHATE 10 MG/ML IJ SOLN
INTRAMUSCULAR | Status: DC | PRN
  Administered 2015-01-15: 4 mg via INTRAVENOUS

## 2015-01-15 MED ORDER — DIPHENHYDRAMINE HCL 12.5 MG/5ML PO ELIX: 12.5000 mg | ORAL_SOLUTION | Freq: Four times a day (QID) | ORAL | Status: DC | PRN

## 2015-01-15 MED ORDER — SODIUM CHLORIDE 0.9 % IJ SOLN: 9.0000 mL | INTRAMUSCULAR | Status: DC | PRN

## 2015-01-15 MED ORDER — PROPOFOL 10 MG/ML IV BOLUS
INTRAVENOUS | Status: AC
  Filled 2015-01-15: qty 20

## 2015-01-15 MED ORDER — KETOROLAC TROMETHAMINE 30 MG/ML IJ SOLN
INTRAMUSCULAR | Status: AC
  Filled 2015-01-15: qty 1

## 2015-01-15 MED ORDER — BUPIVACAINE HCL (PF) 0.25 % IJ SOLN
INTRAMUSCULAR | Status: DC | PRN
  Administered 2015-01-15: 10 mL

## 2015-01-15 MED ORDER — FENTANYL CITRATE (PF) 100 MCG/2ML IJ SOLN
INTRAMUSCULAR | Status: AC
  Filled 2015-01-15: qty 4

## 2015-01-15 MED ORDER — ALBUTEROL SULFATE (2.5 MG/3ML) 0.083% IN NEBU: 2.5000 mg | INHALATION_SOLUTION | RESPIRATORY_TRACT | Status: DC | PRN

## 2015-01-15 MED ORDER — MIDAZOLAM HCL 2 MG/2ML IJ SOLN
INTRAMUSCULAR | Status: DC | PRN
  Administered 2015-01-15: 1 mg via INTRAVENOUS

## 2015-01-15 MED ORDER — MEPERIDINE HCL 25 MG/ML IJ SOLN: 6.2500 mg | INTRAMUSCULAR | Status: DC | PRN

## 2015-01-15 MED ORDER — LACTATED RINGERS IR SOLN
Status: DC | PRN
Start: 2015-01-15 — End: 2015-01-15
  Administered 2015-01-15: 3000 mL

## 2015-01-15 MED ORDER — FENTANYL CITRATE (PF) 250 MCG/5ML IJ SOLN
INTRAMUSCULAR | Status: AC
  Filled 2015-01-15: qty 25

## 2015-01-15 MED ORDER — GLYCOPYRROLATE 0.2 MG/ML IJ SOLN
INTRAMUSCULAR | Status: DC | PRN
  Administered 2015-01-15: 0.2 mg via INTRAVENOUS
  Administered 2015-01-15: 0.4 mg via INTRAVENOUS

## 2015-01-15 MED ORDER — IBUPROFEN 600 MG PO TABS
600.0000 mg | ORAL_TABLET | Freq: Four times a day (QID) | ORAL | Status: DC | PRN
  Administered 2015-01-15 – 2015-01-16 (×3): 600 mg via ORAL
  Filled 2015-01-15 (×3): qty 1

## 2015-01-15 MED ORDER — LIDOCAINE HCL (CARDIAC) 20 MG/ML IV SOLN
INTRAVENOUS | Status: DC | PRN
  Administered 2015-01-15: 30 mg via INTRAVENOUS

## 2015-01-15 MED ORDER — FENTANYL CITRATE (PF) 100 MCG/2ML IJ SOLN: 25.0000 ug | INTRAMUSCULAR | Status: DC | PRN

## 2015-01-15 MED ORDER — FENTANYL CITRATE (PF) 100 MCG/2ML IJ SOLN
INTRAMUSCULAR | Status: DC | PRN
Start: 2015-01-15 — End: 2015-01-15
  Administered 2015-01-15 (×3): 50 ug via INTRAVENOUS
  Administered 2015-01-15: 100 ug via INTRAVENOUS
  Administered 2015-01-15: 50 ug via INTRAVENOUS

## 2015-01-15 MED ORDER — NITROGLYCERIN 0.4 MG SL SUBL: 0.4000 mg | SUBLINGUAL_TABLET | SUBLINGUAL | Status: DC | PRN

## 2015-01-15 MED ORDER — ROCURONIUM BROMIDE 100 MG/10ML IV SOLN
INTRAVENOUS | Status: AC
  Filled 2015-01-15: qty 1

## 2015-01-15 MED ORDER — NALOXONE HCL 0.4 MG/ML IJ SOLN: 0.4000 mg | INTRAMUSCULAR | Status: DC | PRN

## 2015-01-15 MED ORDER — OXYCODONE-ACETAMINOPHEN 5-325 MG PO TABS: 1.0000 | ORAL_TABLET | ORAL | Status: DC | PRN

## 2015-01-15 MED ORDER — GLYCOPYRROLATE 0.2 MG/ML IJ SOLN
INTRAMUSCULAR | Status: AC
  Filled 2015-01-15: qty 3

## 2015-01-15 MED ORDER — HYDROMORPHONE HCL 1 MG/ML IJ SOLN
INTRAMUSCULAR | Status: AC
  Filled 2015-01-15: qty 1

## 2015-01-15 MED ORDER — ONDANSETRON HCL 4 MG/2ML IJ SOLN: 4.0000 mg | Freq: Four times a day (QID) | INTRAMUSCULAR | Status: DC | PRN

## 2015-01-15 MED ORDER — LACTATED RINGERS IV SOLN
INTRAVENOUS | Status: DC
  Administered 2015-01-15 (×3): via INTRAVENOUS

## 2015-01-15 MED ORDER — LIDOCAINE HCL (CARDIAC) 20 MG/ML IV SOLN
INTRAVENOUS | Status: AC
  Filled 2015-01-15: qty 5

## 2015-01-15 MED ORDER — ALUMINUM & MAGNESIUM HYDROXIDE 225-200 MG/5ML PO SUSP: 15.0000 mL | ORAL | Status: DC | PRN

## 2015-01-15 MED ORDER — HYDROMORPHONE HCL 1 MG/ML IJ SOLN: 0.2000 mg | INTRAMUSCULAR | Status: DC | PRN

## 2015-01-15 MED ORDER — NEOSTIGMINE METHYLSULFATE 10 MG/10ML IV SOLN
INTRAVENOUS | Status: DC | PRN
  Administered 2015-01-15: 4 mg via INTRAVENOUS

## 2015-01-15 MED ORDER — ONDANSETRON HCL 4 MG/2ML IJ SOLN
INTRAMUSCULAR | Status: AC
  Filled 2015-01-15: qty 2

## 2015-01-15 MED ORDER — HYDROMORPHONE HCL 1 MG/ML IJ SOLN
INTRAMUSCULAR | Status: DC | PRN
  Administered 2015-01-15 (×2): 0.5 mg via INTRAVENOUS

## 2015-01-15 MED ORDER — MIDAZOLAM HCL 2 MG/2ML IJ SOLN
INTRAMUSCULAR | Status: AC
  Filled 2015-01-15: qty 4

## 2015-01-15 MED ORDER — VERAPAMIL HCL ER 180 MG PO TBCR
360.0000 mg | EXTENDED_RELEASE_TABLET | Freq: Every day | ORAL | Status: DC
  Filled 2015-01-15: qty 2

## 2015-01-15 MED ORDER — ALUM & MAG HYDROXIDE-SIMETH 200-200-20 MG/5ML PO SUSP
15.0000 mL | ORAL | Status: DC | PRN
  Administered 2015-01-16: 15 mL via ORAL
  Filled 2015-01-15: qty 30

## 2015-01-15 MED ORDER — SCOPOLAMINE 1 MG/3DAYS TD PT72: 1.0000 | MEDICATED_PATCH | Freq: Once | TRANSDERMAL | Status: DC

## 2015-01-15 MED ORDER — PROPOFOL 10 MG/ML IV BOLUS
INTRAVENOUS | Status: DC | PRN
  Administered 2015-01-15: 200 mg via INTRAVENOUS

## 2015-01-15 MED ORDER — METOCLOPRAMIDE HCL 5 MG/ML IJ SOLN: 10.0000 mg | Freq: Once | INTRAMUSCULAR | Status: DC | PRN

## 2015-01-15 SURGICAL SUPPLY — 45 items
BLADE SURG 15 STRL LF C SS BP (BLADE) ×1 IMPLANT
BLADE SURG 15 STRL SS (BLADE) ×2
CABLE HIGH FREQUENCY MONO STRZ (ELECTRODE) ×3 IMPLANT
CATH ROBINSON RED A/P 16FR (CATHETERS) ×3 IMPLANT
CLOSURE WOUND 1/4 X3 (GAUZE/BANDAGES/DRESSINGS)
CLOTH BEACON ORANGE TIMEOUT ST (SAFETY) ×3 IMPLANT
CONT PATH 16OZ SNAP LID 3702 (MISCELLANEOUS) ×3 IMPLANT
COVER BACK TABLE 60X90IN (DRAPES) ×3 IMPLANT
DECANTER SPIKE VIAL GLASS SM (MISCELLANEOUS) ×3 IMPLANT
DRSG COVADERM PLUS 2X2 (GAUZE/BANDAGES/DRESSINGS) ×6 IMPLANT
DRSG OPSITE POSTOP 3X4 (GAUZE/BANDAGES/DRESSINGS) ×3 IMPLANT
DURAPREP 26ML APPLICATOR (WOUND CARE) ×3 IMPLANT
ELECT LIGASURE LONG (ELECTRODE) ×3 IMPLANT
ELECT REM PT RETURN 9FT ADLT (ELECTROSURGICAL) ×3
ELECTRODE REM PT RTRN 9FT ADLT (ELECTROSURGICAL) ×1 IMPLANT
FORCEPS CUTTING 45CM 5MM (CUTTING FORCEPS) ×6 IMPLANT
GLOVE BIO SURGEON STRL SZ8 (GLOVE) ×3 IMPLANT
GLOVE BIOGEL PI IND STRL 6.5 (GLOVE) ×1 IMPLANT
GLOVE BIOGEL PI IND STRL 7.0 (GLOVE) ×4 IMPLANT
GLOVE BIOGEL PI INDICATOR 6.5 (GLOVE) ×2
GLOVE BIOGEL PI INDICATOR 7.0 (GLOVE) ×8
GLOVE SURG ORTHO 8.0 STRL STRW (GLOVE) ×9 IMPLANT
LIQUID BAND (GAUZE/BANDAGES/DRESSINGS) ×3 IMPLANT
NEEDLE INSUFFLATION 120MM (ENDOMECHANICALS) ×3 IMPLANT
NS IRRIG 1000ML POUR BTL (IV SOLUTION) ×3 IMPLANT
PACK LAVH (CUSTOM PROCEDURE TRAY) ×3 IMPLANT
PACK ROBOTIC GOWN (GOWN DISPOSABLE) ×3 IMPLANT
PAD POSITIONING PINK XL (MISCELLANEOUS) ×3 IMPLANT
SET IRRIG TUBING LAPAROSCOPIC (IRRIGATION / IRRIGATOR) ×3 IMPLANT
SOLUTION ELECTROLUBE (MISCELLANEOUS) IMPLANT
STRIP CLOSURE SKIN 1/4X3 (GAUZE/BANDAGES/DRESSINGS) IMPLANT
SUT MNCRL 0 MO-4 VIOLET 18 CR (SUTURE) ×2 IMPLANT
SUT MNCRL 0 VIOLET 6X18 (SUTURE) ×1 IMPLANT
SUT MNCRL AB 0 CT1 27 (SUTURE) IMPLANT
SUT MON AB 2-0 CT1 36 (SUTURE) IMPLANT
SUT MONOCRYL 0 6X18 (SUTURE) ×2
SUT MONOCRYL 0 MO 4 18  CR/8 (SUTURE) ×4
SUT VICRYL 0 UR6 27IN ABS (SUTURE) ×3 IMPLANT
SUT VICRYL RAPIDE 3 0 (SUTURE) ×3 IMPLANT
TOWEL OR 17X24 6PK STRL BLUE (TOWEL DISPOSABLE) ×6 IMPLANT
TRAY FOLEY CATH SILVER 14FR (SET/KITS/TRAYS/PACK) ×3 IMPLANT
TROCAR OPTI TIP 5M 100M (ENDOMECHANICALS) ×3 IMPLANT
TROCAR XCEL DIL TIP R 11M (ENDOMECHANICALS) ×3 IMPLANT
WARMER LAPAROSCOPE (MISCELLANEOUS) ×3 IMPLANT
WATER STERILE IRR 1000ML POUR (IV SOLUTION) ×3 IMPLANT

## 2015-01-15 NOTE — Transfer of Care (Signed)
Immediate Anesthesia Transfer of Care Note  Patient: Ana English  Procedure(s) Performed: Procedure(s): LAPAROSCOPIC ASSISTED VAGINAL HYSTERECTOMY WITH BILATERAL SALPINGO OOPHORECTOMY (Bilateral)  Patient Location: PACU  Anesthesia Type:General  Level of Consciousness: awake, sedated and patient cooperative  Airway & Oxygen Therapy: Patient connected to face mask  Post-op Assessment: Report given to RN and Post -op Vital signs reviewed and stable  Post vital signs: Reviewed and stable  Last Vitals:  Filed Vitals:   01/15/15 0605  BP: 151/88  Pulse:   Temp:   Resp:     Complications: No apparent anesthesia complications

## 2015-01-15 NOTE — Progress Notes (Signed)
RN in to room to check oxygen saturation, and patient had CPAP lying on head of bed. RN educated pt on reasons to continue wearing CPAP, and patient states it makes her uncomfortable, but she understands... Will continue to monitor patient.

## 2015-01-15 NOTE — Anesthesia Procedure Notes (Signed)
Procedure Name: Intubation Date/Time: 01/15/2015 7:38 AM Performed by: Tobin Chad Pre-anesthesia Checklist: Patient identified, Timeout performed, Emergency Drugs available, Suction available and Patient being monitored Patient Re-evaluated:Patient Re-evaluated prior to inductionOxygen Delivery Method: Circle system utilized and Simple face mask Preoxygenation: Pre-oxygenation with 100% oxygen Intubation Type: IV induction Ventilation: Mask ventilation without difficulty Laryngoscope Size: Mac and 3 Grade View: Grade II Tube type: Oral Tube size: 7.0 mm Number of attempts: 1 Placement Confirmation: ETT inserted through vocal cords under direct vision,  positive ETCO2 and breath sounds checked- equal and bilateral Secured at: 20 (com  edentulous) cm Tube secured with: Tape

## 2015-01-15 NOTE — Anesthesia Postprocedure Evaluation (Signed)
Anesthesia Post Note  Patient: Ana English  Procedure(s) Performed: Procedure(s) (LRB): LAPAROSCOPIC ASSISTED VAGINAL HYSTERECTOMY WITH BILATERAL SALPINGO OOPHORECTOMY (Bilateral)  Anesthesia type: General  Patient location: PACU  Post pain: Pain level controlled  Post assessment: Post-op Vital signs reviewed  Last Vitals:  Filed Vitals:   01/15/15 1120  BP:   Pulse: 68  Temp:   Resp: 24    Post vital signs: Reviewed  Level of consciousness: sedated  Complications: No apparent anesthesia complications. Pt was slow to awake from anesthesia. She is at risk for OSA so will be discharged to the floor on a CPAP mask

## 2015-01-15 NOTE — Brief Op Note (Signed)
01/15/2015  9:15 AM  PATIENT:  Ana English  63 y.o. female  PRE-OPERATIVE DIAGNOSIS:  ABNORMAL UTERINE BLEEDING, POST MENOPAUSAL BLEEDING   POST-OPERATIVE DIAGNOSIS:  abnormal uterine bleeding; post menopausal bleeding  PROCEDURE:  Procedure(s): LAPAROSCOPIC ASSISTED VAGINAL HYSTERECTOMY WITH BILATERAL SALPINGO OOPHORECTOMY (Bilateral)  SURGEON:  Surgeon(s) and Role:    * Louretta Shorten, MD - Primary    * W Evette Cristal, MD - Assisting  PHYSICIAN ASSISTANT:   ASSISTANTS: Neal   ANESTHESIA:   local and general  EBL:  Total I/O In: 1300 [I.V.:1300] Out: 400 [Urine:300; Blood:100]  BLOOD ADMINISTERED:none  DRAINS: Urinary Catheter (Foley)   LOCAL MEDICATIONS USED:  MARCAINE     SPECIMEN:  Source of Specimen:  uterus, tubes and ovaries  DISPOSITION OF SPECIMEN:  PATHOLOGY  COUNTS:  YES  TOURNIQUET:  * No tourniquets in log *  DICTATION: .Other Dictation: Dictation Number 1  PLAN OF CARE: Admit for overnight observation  PATIENT DISPOSITION:  PACU - hemodynamically stable.   Delay start of Pharmacological VTE agent (>24hrs) due to surgical blood loss or risk of bleeding: no

## 2015-01-16 DIAGNOSIS — D251 Intramural leiomyoma of uterus: Secondary | ICD-10-CM | POA: Diagnosis not present

## 2015-01-16 LAB — CBC
HCT: 37.7 % (ref 36.0–46.0)
HEMOGLOBIN: 12.4 g/dL (ref 12.0–15.0)
MCH: 31.2 pg (ref 26.0–34.0)
MCHC: 32.9 g/dL (ref 30.0–36.0)
MCV: 95 fL (ref 78.0–100.0)
PLATELETS: 181 10*3/uL (ref 150–400)
RBC: 3.97 MIL/uL (ref 3.87–5.11)
RDW: 13.4 % (ref 11.5–15.5)
WBC: 12.9 10*3/uL — ABNORMAL HIGH (ref 4.0–10.5)

## 2015-01-16 MED ORDER — PANTOPRAZOLE SODIUM 40 MG PO TBEC
40.0000 mg | DELAYED_RELEASE_TABLET | Freq: Every day | ORAL | Status: DC
  Administered 2015-01-16: 40 mg via ORAL
  Filled 2015-01-16: qty 1

## 2015-01-16 MED ORDER — VERAPAMIL HCL ER 180 MG PO TBCR
360.0000 mg | EXTENDED_RELEASE_TABLET | Freq: Every day | ORAL | Status: DC
  Administered 2015-01-16: 360 mg via ORAL
  Filled 2015-01-16 (×2): qty 2

## 2015-01-16 NOTE — Addendum Note (Signed)
Addendum  created 01/16/15 0751 by Jonna Munro, CRNA   Modules edited: Notes Section   Notes Section:  File: [id number]

## 2015-01-16 NOTE — Progress Notes (Signed)
Pt  Ambulated out    Teaching  Complete

## 2015-01-16 NOTE — Discharge Summary (Signed)
Physician Discharge Summary  Patient ID: Ana English MRN: [id number] DOB/AGE: February 21, 1951 64 y.o.  Admit date: 01/15/2015 Discharge date: 01/16/2015  Admission Diagnoses:  Discharge Diagnoses:  Active Problems:   S/P laparoscopic assisted vaginal hysterectomy (LAVH)   Discharged Condition: good  Hospital Course: pt underwent an uncomplicated LAVH BSO with EBL 100cc.  Her post op care was unremarkable with quick return of bowel and bladder function on day of surgery.  On post op d1 she was tolerating regular diet, using only motrin for pain, passing flatus and desired d/c home.  Consults: None  Significant Diagnostic Studies: labs: Hgb 12.4  Treatments: surgery: LAVH BSO  Discharge Exam: Blood pressure 125/60, pulse 72, temperature 98.5 F (36.9 C), temperature source Oral, resp. rate 18, height 5\' 6"  (1.676 m), weight 259 lb (117.482 kg), SpO2 98 %. General appearance: alert, cooperative, appears older than stated age and no distress GI: soft, non-tender; bowel sounds normal; no masses,  no organomegaly Incision/Wound:CD&I  Disposition: Final discharge disposition not confirmed  Discharge Instructions    Call MD for:  difficulty breathing, headache or visual disturbances    Complete by:  As directed      Call MD for:  persistant nausea and vomiting    Complete by:  As directed      Call MD for:  redness, tenderness, or signs of infection (pain, swelling, redness, odor or green/yellow discharge around incision site)    Complete by:  As directed      Call MD for:  severe uncontrolled pain    Complete by:  As directed      Call MD for:  temperature >100.4    Complete by:  As directed      Diet general    Complete by:  As directed      Discharge instructions    Complete by:  As directed   FU in office in 2 weeks as scheduled     Driving Restrictions    Complete by:  As directed   No driving for 1 week     Increase activity slowly    Complete by:  As directed      Lifting restrictions    Complete by:  As directed   No lifting anything greater than 10 pounds (if you have to ask, don't lift it)     Sexual Activity Restrictions    Complete by:  As directed   Nothing in the vagina for 6 weeks            Medication List    TAKE these medications        aluminum & magnesium hydroxide 225-200 MG/5ML suspension  Commonly known as:  MAALOX  Take by mouth as needed.     aspirin 81 MG tablet  Take 81 mg by mouth daily.     calcium carbonate 500 MG chewable tablet  Commonly known as:  TUMS - dosed in mg elemental calcium  Chew 1 tablet by mouth daily.     etodolac 400 MG tablet  Commonly known as:  LODINE  Take 400 mg by mouth 2 (two) times daily.     levalbuterol 45 MCG/ACT inhaler  Commonly known as:  XOPENEX HFA  Inhale 2 puffs into the lungs every 4 (four) hours as needed for wheezing.     nitroGLYCERIN 0.4 MG SL tablet  Commonly known as:  NITROSTAT  Place 0.4 mg under the tongue every 5 (five) minutes as needed. May repeat for up to 3  doses.     omeprazole 40 MG capsule  Commonly known as:  PRILOSEC  Take 40 mg by mouth daily.     rivaroxaban 20 MG Tabs tablet  Commonly known as:  XARELTO  Take 20 mg by mouth daily with supper.     valsartan 160 MG tablet  Commonly known as:  DIOVAN  Take 160 mg by mouth daily.     verapamil 360 MG 24 hr capsule  Commonly known as:  VERELAN PM  Take 360 mg by mouth at bedtime.     vitamin B-12 1000 MCG tablet  Commonly known as:  CYANOCOBALAMIN  Take 1,000 mcg by mouth daily.         Signed: Dolores Mcgovern C 01/16/2015, 11:53 AM

## 2015-01-16 NOTE — Op Note (Signed)
Ana English, Ana English NO.:  [id number]  MEDICAL RECORD NO.:  [id number]  LOCATION:  8466                          FACILITY:  [facility]  PHYSICIAN:  Monia Sabal. Corinna Capra, M.D.    DATE OF BIRTH:  Aug 17, 1950  DATE OF PROCEDURE:  01/15/2015 DATE OF DISCHARGE:                              OPERATIVE REPORT   PREOPERATIVE DIAGNOSES:  Postmenopausal bleeding, abnormal uterine bleeding.  POSTOPERATIVE DIAGNOSES:  Postmenopausal bleeding, abnormal uterine bleeding.  PROCEDURES:  Laparoscopic-assisted vaginal hysterectomy with bilateral salpingo-oophorectomy and lysis of adhesions.  SURGEON:  Monia Sabal. Corinna Capra, M.D.  ASSISTANTMaisie Fus, M.D.  ANESTHESIA:  General endotracheal.  INDICATIONS:  Ms. Mirkin is a 64 year old black female, who is having persistent problems with abnormal uterine bleeding.  Had undergone hysteroscopy, D and C year ago with some resolution symptoms.  She presents again with abnormal bleeding.  Had a saline infusion ultrasound showing normal __________.  Uterus was suspicious for adenomyosis.  The patient at this time desires definitive surgical intervention plus hysterectomy.  I discussed the risks and benefits of the procedure at length.  Informed consent was obtained.  See history and physical for further details.  FINDINGS AT THE TIME OF SURGERY:  Adhesions of the omentum to the left pelvic sidewall; otherwise, normal-appearing uterus, tubes, and ovaries.  DESCRIPTION OF PROCEDURE:  After adequate analgesia, the patient was placed in dorsal lithotomy position.  She was sterilely prepped and draped, and bladder was sterilely drained.  A Graves speculum was placed.  A Hulka tenaculum was placed on the cervix.  A 1 cm infraumbilical skin incision was made.  A Veress needle was inserted. The abdomen was insufflated with dullness to percussion.  An 11-mm trocar was inserted.  The laparoscope was inserted, the above findings were noted.  A 5-mm  trocar was inserted 2 fingerbreadths above the pubic symphysis under direct visualization.  After careful and systematic evaluation of the abdomen and pelvis, gyrus cutting forceps was used to ligate across the omental adhesions.  Care taken to avoid the underlying pelvis and achieved good hemostasis and reduced the adhesions.  The left infundibulopelvic ligament was identified, dissected across the IFP down across the round ligament.  The inferior portions of the broad ligament with the left tube and ovary falling towards the uterus.  Care taken to avoid the underlying ureter and at the same time, achieved good hemostasis.  The right IFP was similarly taken down with Gyrus cutting forceps down across the round ligament to the inferior portion of broad ligaments, good hemostasis achieved.  The uterovesical junction was identified and elevated with atraumatic grasper and a bladder flap was created.  The abdomen was desufflated.  Legs were repositioned. Weighted speculum was placed in the vagina.  Posterior colpotomy was performed.  The cervix was then circumscribed with Bovie cautery. LigaSure instrument was used to ligate across the uterosacral ligaments bilaterally, bladder pillars and cardinal ligaments.  The anterior vaginal mucosa was dissected off the cervix.  The anterior peritoneum was entered bluntly and a Deaver retractor was placed at the bladder. LigaSure instrument was used to ligate across the uterovesical junction to the inferior portion of the broad  ligament.  The uterus was then removed.  Both the tubes and ovaries were attached.  The uterosacral ligaments were identified and suture ligated with figure-of-eight suture of 0 Monocryl suture.  The posterior peritoneum was then closed in a pursestring fashion using 0 Monocryl suture.  Uterosacral ligaments were then plicated in the midline again using figure-of-eight 0 Monocryl suture.  The vagina was then closed using  figure-of-eight of 0 Monocryl suture in a vertical fashion.  Good approximation and good hemostasis noted.  Foley catheter was placed with return of clear yellow urine. The legs were repositioned.  Abdomen was re-insufflated.  A Nezhat suction irrigator was inserted, and after copious amount of irrigation, adequate hemostasis was assured.  Care was taken to __________ the pedicles carefully.  Ureters were identified bilaterally.  Good peristalsis and no evidence of any trauma to the ureters or the __________ noted and after good hemostasis appeared to have been seen, then photodocumentation was carried out.  The abdomen was then desufflated.  The trocars were removed.  The infraumbilical skin incision was closed with 0 Vicryl interrupted suture in the fascia, 3-0 Vicryl Rapide subcuticular suture, __________ 3-0 Vicryl Rapide subcuticular suture and Dermabond.  Incisions were injected with 0.25% Marcaine, 10 mL was used.  The patient was transferred to the recovery room in stable condition.  Sponges and instrument counts were normal x3. Estimated blood loss was 100 mL.  The patient received 2 g of cefotetan preoperatively and will be on observation.     Monia Sabal Corinna Capra, M.D.     DCL/MEDQ  D:  01/15/2015  T:  01/16/2015  Job:  [id number]

## 2015-01-16 NOTE — Anesthesia Postprocedure Evaluation (Signed)
  Anesthesia Post-op Note  Patient: Ana English  Procedure(s) Performed: Procedure(s): LAPAROSCOPIC ASSISTED VAGINAL HYSTERECTOMY WITH BILATERAL SALPINGO OOPHORECTOMY (Bilateral)  Patient Location: [facility]  Anesthesia Type:General  Level of Consciousness: awake, alert  and oriented  Airway and Oxygen Therapy: Patient Spontanous Breathing  Post-op Pain: none  Post-op Assessment: Post-op Vital signs reviewed, Patient's Cardiovascular Status Stable, Respiratory Function Stable, No signs of Nausea or vomiting, Adequate PO intake and Pain level controlled              Post-op Vital Signs: Reviewed and stable  Last Vitals:  Filed Vitals:   01/16/15 0558  BP: 127/57  Pulse: 62  Temp: 37 C  Resp: 18    Complications: No apparent anesthesia complications

## 2015-02-04 ENCOUNTER — Ambulatory Visit
Admission: RE | Admit: 2015-02-04 | Discharge: 2015-02-04 | Disposition: A | Discharge status: Home / Self Care | Source: Ambulatory Visit

## 2015-02-04 DIAGNOSIS — Z1231 Encounter for screening mammogram for malignant neoplasm of breast: Secondary | ICD-10-CM

## 2015-07-26 ENCOUNTER — Other Ambulatory Visit: Payer: Self-pay | Admitting: Pulmonary Disease

## 2015-07-26 DIAGNOSIS — N63 Unspecified lump in unspecified breast: Secondary | ICD-10-CM

## 2015-08-01 ENCOUNTER — Ambulatory Visit
Admission: RE | Admit: 2015-08-01 | Discharge: 2015-08-01 | Disposition: A | Discharge status: Home / Self Care | Source: Ambulatory Visit | Attending: Pulmonary Disease | Admitting: Pulmonary Disease

## 2015-08-01 DIAGNOSIS — N63 Unspecified lump in unspecified breast: Secondary | ICD-10-CM

## 2016-02-05 ENCOUNTER — Other Ambulatory Visit (HOSPITAL_COMMUNITY): Payer: Self-pay | Admitting: Pulmonary Disease

## 2016-02-05 ENCOUNTER — Ambulatory Visit (HOSPITAL_COMMUNITY)
Admission: RE | Admit: 2016-02-05 | Discharge: 2016-02-05 | Disposition: A | Discharge status: Home / Self Care | Payer: Medicare Other | Source: Ambulatory Visit | Attending: Pulmonary Disease | Admitting: Pulmonary Disease

## 2016-02-05 DIAGNOSIS — M25531 Pain in right wrist: Secondary | ICD-10-CM

## 2016-02-17 ENCOUNTER — Encounter: Payer: Self-pay | Admitting: Orthopedic Surgery

## 2016-02-17 ENCOUNTER — Ambulatory Visit (INDEPENDENT_AMBULATORY_CARE_PROVIDER_SITE_OTHER): Payer: Medicare Other | Admitting: Orthopedic Surgery

## 2016-02-17 VITALS — BP 148/96 | HR 71 | Ht 66.5 in | Wt 279.0 lb

## 2016-02-17 DIAGNOSIS — M654 Radial styloid tenosynovitis [de Quervain]: Secondary | ICD-10-CM

## 2016-02-17 NOTE — Progress Notes (Signed)
Chief Complaint  Patient presents with  . Wrist Pain    RT WRIST/FOREARM PAIN   HPI - 65 year old female with four-month history of pain wrist area in the extensor compartment #1 and 3 and 2. Pain and swelling pain with ulnar deviation. Pain did not improve with prednisone and Lodine. She listed her review of systems is completely normal.  Review of Systems  All other systems reviewed and are negative.   Past Medical History:  Diagnosis Date  . Anemia   . Asthma   . Chest discomfort   . Complication of anesthesia    dysrhythmmia postop- pre atrial fib dx.  Marland Kitchen DJD (degenerative joint disease)   . Dysrhythmia    atrial fib.  Marland Kitchen GERD (gastroesophageal reflux disease)   . Hyperlipidemia   . Hypertension   . Mass on back   . PSVT (paroxysmal supraventricular tachycardia) ([place])   . Shortness of breath dyspnea    with A- fib  . Sleep apnea    rare use of CPAP    Past Surgical History:  Procedure Laterality Date  . CESAREAN SECTION  over 30 years ago   x3  . CHOLECYSTECTOMY  1990  . COLONOSCOPY     Remote   Family History  Problem Relation Age of Onset  . Arthritis Father    Social History  Substance Use Topics  . Smoking status: Never Smoker  . Smokeless tobacco: Never Used  . Alcohol use No   Current Meds  Medication Sig  . etodolac (LODINE) 400 MG tablet Take 400 mg by mouth 2 (two) times daily.  Marland Kitchen omeprazole (PRILOSEC) 40 MG capsule Take 40 mg by mouth daily.  . rivaroxaban (XARELTO) 20 MG TABS tablet Take 20 mg by mouth daily with supper.  . valsartan (DIOVAN) 160 MG tablet Take 160 mg by mouth daily.    BP (!) 148/96   Pulse 71   Ht 5' 6.5" (1.689 m)   Wt 279 lb (126.6 kg)   BMI 44.36 kg/m   Physical Exam  Constitutional: She is oriented to person, place, and time. She appears well-developed and well-nourished. No distress.  Cardiovascular: Normal rate and intact distal pulses.   Neurological: She is alert and oriented to person, place, and time. She  has normal reflexes. She exhibits normal muscle tone. Coordination normal.  Skin: Skin is warm and dry. No rash noted. She is not diaphoretic. No erythema. No pallor.  Psychiatric: She has a normal mood and affect. Her behavior is normal. Judgment and thought content normal.    Ortho Exam  Right wrist tenderness and swelling over the first extensor compartment as well as the muscle of the first and third compartments. Painful ulnar deviation are normal range of motion in the wrist. Wrist is ligaments are stable strength is normal in the hand skin is normal pulses are good lymph nodes are negative sensation is normal ASSESSMENT: My personal interpretation of the images:  Normal wrist x-ray x-ray was done at the hospital report also normal    PLAN De Quervain's syndrome  Splint 6 weeks ice 3 times a day follow-up 6 weeks  Arther Abbott, MD 02/17/2016 9:19 AM  .meds

## 2016-02-17 NOTE — Patient Instructions (Addendum)
Splint 6 weeks  Ice 3 times a day  [employer] Disease Alfonse Ras disease is inflammation of the tendon on the thumb side of the wrist. Tendons are cords of tissue that connect bones to muscles. The tendons in your hand pass through a tunnel, or sheath. A slippery layer of tissue (synovium) lets the tendons move smoothly in the sheath. With de Quervain disease, the sheath swells or thickens, causing friction and pain. The condition is also called de Quervain tendinosis and de Quervain syndrome. It occurs most often in women who are 18-46 years old. CAUSES  The exact cause of de Quervain disease is not known. It may result from:   Overusing your hands, especially with repetitive motions that involve twisting your hand or using a forceful grip.  Pregnancy.  Rheumatoid disease. RISK FACTORS You may have a greater risk for de Quervain disease if you:  Are a middle-aged woman.  Are pregnant.  Have rheumatoid arthritis.  Have diabetes.  Use your hands far more than normal, especially with a tight grip or excessive twisting. SIGNS AND SYMPTOMS Pain on the thumb side of your wrist is the main symptom of de Quervain disease. Other signs and symptoms include:  Pain that gets worse when you grasp something or turn your wrist.  Pain that extends up the forearm.  Cysts in the area of the pain.  Swelling of your wrist and hand.  A sensation of snapping in the wrist.  Trouble moving the thumb and wrist. DIAGNOSIS  Your health care provider may diagnose de Quervain disease based on your signs and symptoms. A physical exam will also be done. A simple test Wynn Maudlin test) that involves pulling your thumb and wrist to see if this causes pain can help determine whether you have the condition. Sometimes you may need to have an X-ray.  TREATMENT  Avoiding any activity that causes pain and swelling is the best treatment. Other options include:  Wearing a splint.  Taking medicine.  Anti-inflammatory medicines and corticosteroid injections may reduce inflammation and relieve pain.  Having surgery if other treatments do not work. HOME CARE INSTRUCTIONS   Using ice can be helpful after doing activities that involve the sore wrist. To apply ice to the injured area:  Put ice in a plastic bag.  Place a towel between your skin and the bag.  Leave the ice on for 20 minutes, 2-3 times a day.  Take medicines only as directed by your health care provider.  Wear your splint as directed. This will allow your hand to rest and heal. SEEK MEDICAL CARE IF:   Your pain medicine does not help.   Your pain gets worse.  You develop new symptoms. MAKE SURE YOU:   Understand these instructions.  Will watch your condition.  Will get help right away if you are not doing well or get worse.   This information is not intended to replace advice given to you by your health care provider. Make sure you discuss any questions you have with your health care provider.   Document Released: 02/03/2001 Document Revised: 06/01/2014 Document Reviewed: 09/13/2013 Elsevier Interactive Patient Education [employer].

## 2016-02-19 ENCOUNTER — Other Ambulatory Visit: Payer: Self-pay | Admitting: Obstetrics and Gynecology

## 2016-02-19 ENCOUNTER — Other Ambulatory Visit: Payer: Self-pay | Admitting: Pulmonary Disease

## 2016-02-19 DIAGNOSIS — Z1231 Encounter for screening mammogram for malignant neoplasm of breast: Secondary | ICD-10-CM

## 2016-02-24 ENCOUNTER — Ambulatory Visit: Payer: Medicare Other

## 2016-03-02 ENCOUNTER — Telehealth: Payer: Self-pay | Admitting: Orthopedic Surgery

## 2016-03-02 NOTE — Telephone Encounter (Signed)
Patient called saying she left her brace at work and its not there anymore. She would like to know if she could get another one and how much it would cost. I did tell her that her insurance would not cover it since it was lost.

## 2016-03-04 NOTE — Telephone Encounter (Signed)
Patient bought brace over the counter as insurance would not pay for another

## 2016-03-23 ENCOUNTER — Ambulatory Visit: Payer: Medicare Other

## 2016-04-03 ENCOUNTER — Ambulatory Visit: Payer: Medicare Other | Admitting: Orthopedic Surgery

## 2016-09-28 ENCOUNTER — Ambulatory Visit (HOSPITAL_COMMUNITY)
Admission: RE | Admit: 2016-09-28 | Discharge: 2016-09-28 | Disposition: A | Discharge status: Home / Self Care | Source: Ambulatory Visit | Attending: Pulmonary Disease | Admitting: Pulmonary Disease

## 2016-09-28 ENCOUNTER — Other Ambulatory Visit (HOSPITAL_COMMUNITY): Payer: Self-pay | Admitting: Pulmonary Disease

## 2016-09-28 DIAGNOSIS — M899 Disorder of bone, unspecified: Secondary | ICD-10-CM | POA: Diagnosis not present

## 2016-09-28 DIAGNOSIS — R52 Pain, unspecified: Secondary | ICD-10-CM

## 2016-09-28 DIAGNOSIS — M25561 Pain in right knee: Secondary | ICD-10-CM | POA: Diagnosis present

## 2016-10-22 DIAGNOSIS — I10 Essential (primary) hypertension: Secondary | ICD-10-CM | POA: Diagnosis not present

## 2016-10-22 DIAGNOSIS — G4733 Obstructive sleep apnea (adult) (pediatric): Secondary | ICD-10-CM | POA: Diagnosis not present

## 2016-10-22 DIAGNOSIS — I4891 Unspecified atrial fibrillation: Secondary | ICD-10-CM | POA: Diagnosis not present

## 2016-10-22 DIAGNOSIS — M25561 Pain in right knee: Secondary | ICD-10-CM | POA: Diagnosis not present

## 2016-11-24 DIAGNOSIS — M25561 Pain in right knee: Secondary | ICD-10-CM | POA: Diagnosis not present

## 2016-11-26 ENCOUNTER — Ambulatory Visit
Admission: RE | Admit: 2016-11-26 | Discharge: 2016-11-26 | Disposition: A | Discharge status: Home / Self Care | Payer: Medicare Other | Source: Ambulatory Visit | Attending: Pulmonary Disease | Admitting: Pulmonary Disease

## 2016-11-26 DIAGNOSIS — Z1231 Encounter for screening mammogram for malignant neoplasm of breast: Secondary | ICD-10-CM | POA: Diagnosis not present

## 2016-11-26 DIAGNOSIS — I4891 Unspecified atrial fibrillation: Secondary | ICD-10-CM | POA: Diagnosis not present

## 2016-11-26 DIAGNOSIS — G4733 Obstructive sleep apnea (adult) (pediatric): Secondary | ICD-10-CM | POA: Diagnosis not present

## 2016-11-26 DIAGNOSIS — M25561 Pain in right knee: Secondary | ICD-10-CM | POA: Diagnosis not present

## 2016-11-26 DIAGNOSIS — I10 Essential (primary) hypertension: Secondary | ICD-10-CM | POA: Diagnosis not present

## 2016-11-28 DIAGNOSIS — S83231A Complex tear of medial meniscus, current injury, right knee, initial encounter: Secondary | ICD-10-CM | POA: Diagnosis not present

## 2016-12-07 DIAGNOSIS — M25561 Pain in right knee: Secondary | ICD-10-CM | POA: Diagnosis not present

## 2016-12-07 DIAGNOSIS — S83231D Complex tear of medial meniscus, current injury, right knee, subsequent encounter: Secondary | ICD-10-CM | POA: Diagnosis not present

## 2016-12-07 DIAGNOSIS — S82231D Displaced oblique fracture of shaft of right tibia, subsequent encounter for closed fracture with routine healing: Secondary | ICD-10-CM | POA: Diagnosis not present

## 2016-12-22 DIAGNOSIS — I471 Supraventricular tachycardia: Secondary | ICD-10-CM | POA: Diagnosis not present

## 2016-12-22 DIAGNOSIS — I10 Essential (primary) hypertension: Secondary | ICD-10-CM | POA: Diagnosis not present

## 2016-12-22 DIAGNOSIS — I480 Paroxysmal atrial fibrillation: Secondary | ICD-10-CM | POA: Diagnosis not present

## 2016-12-28 DIAGNOSIS — H2513 Age-related nuclear cataract, bilateral: Secondary | ICD-10-CM | POA: Diagnosis not present

## 2016-12-28 DIAGNOSIS — H524 Presbyopia: Secondary | ICD-10-CM | POA: Diagnosis not present

## 2016-12-30 DIAGNOSIS — M948X6 Other specified disorders of cartilage, lower leg: Secondary | ICD-10-CM | POA: Diagnosis not present

## 2016-12-30 DIAGNOSIS — M23221 Derangement of posterior horn of medial meniscus due to old tear or injury, right knee: Secondary | ICD-10-CM | POA: Diagnosis not present

## 2016-12-30 DIAGNOSIS — G8918 Other acute postprocedural pain: Secondary | ICD-10-CM | POA: Diagnosis not present

## 2016-12-30 DIAGNOSIS — S83241A Other tear of medial meniscus, current injury, right knee, initial encounter: Secondary | ICD-10-CM | POA: Diagnosis not present

## 2017-01-12 DIAGNOSIS — S83231D Complex tear of medial meniscus, current injury, right knee, subsequent encounter: Secondary | ICD-10-CM | POA: Diagnosis not present

## 2017-01-12 DIAGNOSIS — Z4789 Encounter for other orthopedic aftercare: Secondary | ICD-10-CM | POA: Diagnosis not present

## 2017-02-25 DIAGNOSIS — S83231D Complex tear of medial meniscus, current injury, right knee, subsequent encounter: Secondary | ICD-10-CM | POA: Diagnosis not present

## 2017-04-26 DIAGNOSIS — Z4789 Encounter for other orthopedic aftercare: Secondary | ICD-10-CM | POA: Diagnosis not present

## 2017-04-26 DIAGNOSIS — M25561 Pain in right knee: Secondary | ICD-10-CM | POA: Diagnosis not present

## 2017-05-21 DIAGNOSIS — F419 Anxiety disorder, unspecified: Secondary | ICD-10-CM | POA: Diagnosis not present

## 2017-05-21 DIAGNOSIS — G4730 Sleep apnea, unspecified: Secondary | ICD-10-CM | POA: Diagnosis not present

## 2017-05-21 DIAGNOSIS — I10 Essential (primary) hypertension: Secondary | ICD-10-CM | POA: Diagnosis not present

## 2017-05-21 DIAGNOSIS — R0602 Shortness of breath: Secondary | ICD-10-CM | POA: Diagnosis not present

## 2017-05-21 DIAGNOSIS — M25561 Pain in right knee: Secondary | ICD-10-CM | POA: Diagnosis not present

## 2017-05-21 DIAGNOSIS — M154 Erosive (osteo)arthritis: Secondary | ICD-10-CM | POA: Diagnosis not present

## 2017-05-21 DIAGNOSIS — I4891 Unspecified atrial fibrillation: Secondary | ICD-10-CM | POA: Diagnosis not present

## 2017-05-21 DIAGNOSIS — E669 Obesity, unspecified: Secondary | ICD-10-CM | POA: Diagnosis not present

## 2017-05-26 ENCOUNTER — Other Ambulatory Visit (HOSPITAL_COMMUNITY): Payer: Self-pay | Admitting: Pulmonary Disease

## 2017-05-26 ENCOUNTER — Ambulatory Visit (HOSPITAL_COMMUNITY)
Admission: RE | Admit: 2017-05-26 | Discharge: 2017-05-26 | Disposition: A | Discharge status: Home / Self Care | Payer: Medicare Other | Source: Ambulatory Visit | Attending: Pulmonary Disease | Admitting: Pulmonary Disease

## 2017-05-26 DIAGNOSIS — M19071 Primary osteoarthritis, right ankle and foot: Secondary | ICD-10-CM | POA: Diagnosis not present

## 2017-05-26 DIAGNOSIS — R52 Pain, unspecified: Secondary | ICD-10-CM | POA: Insufficient documentation

## 2017-05-26 DIAGNOSIS — M778 Other enthesopathies, not elsewhere classified: Secondary | ICD-10-CM | POA: Diagnosis not present

## 2017-05-26 DIAGNOSIS — S8991XA Unspecified injury of right lower leg, initial encounter: Secondary | ICD-10-CM | POA: Diagnosis not present

## 2017-05-26 DIAGNOSIS — M7989 Other specified soft tissue disorders: Secondary | ICD-10-CM | POA: Insufficient documentation

## 2017-05-26 DIAGNOSIS — M25571 Pain in right ankle and joints of right foot: Secondary | ICD-10-CM | POA: Diagnosis not present

## 2017-05-26 DIAGNOSIS — M25561 Pain in right knee: Secondary | ICD-10-CM | POA: Diagnosis not present

## 2017-05-26 DIAGNOSIS — S99911A Unspecified injury of right ankle, initial encounter: Secondary | ICD-10-CM | POA: Diagnosis not present

## 2017-07-08 DIAGNOSIS — L980 Pyogenic granuloma: Secondary | ICD-10-CM | POA: Diagnosis not present

## 2017-10-19 DIAGNOSIS — H2513 Age-related nuclear cataract, bilateral: Secondary | ICD-10-CM | POA: Diagnosis not present

## 2017-12-27 ENCOUNTER — Other Ambulatory Visit: Payer: Self-pay | Admitting: Pulmonary Disease

## 2017-12-27 DIAGNOSIS — Z1231 Encounter for screening mammogram for malignant neoplasm of breast: Secondary | ICD-10-CM

## 2018-01-17 DIAGNOSIS — E669 Obesity, unspecified: Secondary | ICD-10-CM | POA: Diagnosis not present

## 2018-01-17 DIAGNOSIS — I10 Essential (primary) hypertension: Secondary | ICD-10-CM | POA: Diagnosis not present

## 2018-01-17 DIAGNOSIS — R5383 Other fatigue: Secondary | ICD-10-CM | POA: Diagnosis not present

## 2018-01-17 DIAGNOSIS — I4891 Unspecified atrial fibrillation: Secondary | ICD-10-CM | POA: Diagnosis not present

## 2018-01-21 ENCOUNTER — Ambulatory Visit
Admission: RE | Admit: 2018-01-21 | Discharge: 2018-01-21 | Disposition: A | Discharge status: Home / Self Care | Payer: Medicare Other | Source: Ambulatory Visit | Attending: Pulmonary Disease | Admitting: Pulmonary Disease

## 2018-01-21 DIAGNOSIS — Z1231 Encounter for screening mammogram for malignant neoplasm of breast: Secondary | ICD-10-CM | POA: Diagnosis not present

## 2018-02-07 DIAGNOSIS — E669 Obesity, unspecified: Secondary | ICD-10-CM | POA: Diagnosis not present

## 2018-02-07 DIAGNOSIS — I4891 Unspecified atrial fibrillation: Secondary | ICD-10-CM | POA: Diagnosis not present

## 2018-02-07 DIAGNOSIS — I10 Essential (primary) hypertension: Secondary | ICD-10-CM | POA: Diagnosis not present

## 2018-04-25 ENCOUNTER — Encounter (HOSPITAL_COMMUNITY): Payer: Self-pay | Admitting: Emergency Medicine

## 2018-04-25 ENCOUNTER — Other Ambulatory Visit: Payer: Self-pay

## 2018-04-25 ENCOUNTER — Emergency Department (HOSPITAL_COMMUNITY): Payer: Medicare Other

## 2018-04-25 ENCOUNTER — Observation Stay (HOSPITAL_COMMUNITY)
Admission: EM | Admit: 2018-04-25 | Discharge: 2018-04-26 | Disposition: A | Discharge status: Home / Self Care | Payer: Medicare Other | Attending: Pulmonary Disease | Admitting: Pulmonary Disease

## 2018-04-25 DIAGNOSIS — E785 Hyperlipidemia, unspecified: Secondary | ICD-10-CM | POA: Insufficient documentation

## 2018-04-25 DIAGNOSIS — I10 Essential (primary) hypertension: Secondary | ICD-10-CM | POA: Diagnosis not present

## 2018-04-25 DIAGNOSIS — Z79899 Other long term (current) drug therapy: Secondary | ICD-10-CM | POA: Insufficient documentation

## 2018-04-25 DIAGNOSIS — J45909 Unspecified asthma, uncomplicated: Secondary | ICD-10-CM | POA: Diagnosis present

## 2018-04-25 DIAGNOSIS — Z23 Encounter for immunization: Secondary | ICD-10-CM | POA: Insufficient documentation

## 2018-04-25 DIAGNOSIS — Z7982 Long term (current) use of aspirin: Secondary | ICD-10-CM | POA: Insufficient documentation

## 2018-04-25 DIAGNOSIS — K219 Gastro-esophageal reflux disease without esophagitis: Secondary | ICD-10-CM | POA: Diagnosis not present

## 2018-04-25 DIAGNOSIS — R079 Chest pain, unspecified: Secondary | ICD-10-CM | POA: Diagnosis not present

## 2018-04-25 DIAGNOSIS — E876 Hypokalemia: Secondary | ICD-10-CM | POA: Insufficient documentation

## 2018-04-25 DIAGNOSIS — G4730 Sleep apnea, unspecified: Secondary | ICD-10-CM | POA: Insufficient documentation

## 2018-04-25 DIAGNOSIS — Z7901 Long term (current) use of anticoagulants: Secondary | ICD-10-CM | POA: Diagnosis not present

## 2018-04-25 DIAGNOSIS — I480 Paroxysmal atrial fibrillation: Secondary | ICD-10-CM | POA: Insufficient documentation

## 2018-04-25 DIAGNOSIS — R0789 Other chest pain: Secondary | ICD-10-CM | POA: Diagnosis not present

## 2018-04-25 DIAGNOSIS — R000 Tachycardia, unspecified: Secondary | ICD-10-CM | POA: Diagnosis not present

## 2018-04-25 HISTORY — DX: Unspecified atrial fibrillation: I48.91

## 2018-04-25 LAB — BASIC METABOLIC PANEL
Anion gap: 8 (ref 5–15)
BUN: 10 mg/dL (ref 8–23)
CHLORIDE: 106 mmol/L (ref 98–111)
CO2: 25 mmol/L (ref 22–32)
Calcium: 8.9 mg/dL (ref 8.9–10.3)
Creatinine, Ser: 0.64 mg/dL (ref 0.44–1.00)
GFR calc non Af Amer: >60 mL/min (ref >60)
Glucose, Bld: 106 mg/dL — ABNORMAL HIGH (ref 70–99)
Potassium: 3.4 mmol/L — ABNORMAL LOW (ref 3.5–5.1)
SODIUM: 139 mmol/L (ref 135–145)

## 2018-04-25 LAB — CBC
HEMATOCRIT: 45.6 % (ref 36.0–46.0)
HEMOGLOBIN: 14.4 g/dL (ref 12.0–15.0)
MCH: 30.3 pg (ref 26.0–34.0)
MCHC: 31.6 g/dL (ref 30.0–36.0)
MCV: 95.8 fL (ref 80.0–100.0)
NRBC: 0 % (ref 0.0–0.2)
Platelets: 214 10*3/uL (ref 150–400)
RBC: 4.76 MIL/uL (ref 3.87–5.11)
RDW: 13.3 % (ref 11.5–15.5)
WBC: 6 10*3/uL (ref 4.0–10.5)

## 2018-04-25 LAB — TROPONIN I: Troponin I: <0.03 ng/mL (ref <0.03)

## 2018-04-25 LAB — MAGNESIUM: Magnesium: 2 mg/dL (ref 1.7–2.4)

## 2018-04-25 LAB — PHOSPHORUS: Phosphorus: 2.9 mg/dL (ref 2.5–4.6)

## 2018-04-25 MED ORDER — ETODOLAC 400 MG PO TABS
400.0000 mg | ORAL_TABLET | Freq: Every morning | ORAL | Status: DC
  Filled 2018-04-25 (×2): qty 1

## 2018-04-25 MED ORDER — VERAPAMIL HCL ER 180 MG PO TBCR
360.0000 mg | EXTENDED_RELEASE_TABLET | Freq: Every morning | ORAL | Status: DC
  Administered 2018-04-26: 360 mg via ORAL
  Filled 2018-04-25: qty 2

## 2018-04-25 MED ORDER — INFLUENZA VAC SPLIT HIGH-DOSE 0.5 ML IM SUSY
0.5000 mL | PREFILLED_SYRINGE | INTRAMUSCULAR | Status: AC
  Administered 2018-04-26: 0.5 mL via INTRAMUSCULAR
  Filled 2018-04-25: qty 0.5

## 2018-04-25 MED ORDER — RIVAROXABAN 20 MG PO TABS: 20.0000 mg | ORAL_TABLET | Freq: Every day | ORAL | Status: DC

## 2018-04-25 MED ORDER — CALCIUM CARBONATE ANTACID 500 MG PO CHEW: 1.0000 | CHEWABLE_TABLET | Freq: Every day | ORAL | Status: DC | PRN

## 2018-04-25 MED ORDER — MAGNESIUM SULFATE 2 GM/50ML IV SOLN
2.0000 g | Freq: Once | INTRAVENOUS | Status: AC
  Administered 2018-04-25: 2 g via INTRAVENOUS
  Filled 2018-04-25: qty 50

## 2018-04-25 MED ORDER — ACETAMINOPHEN 325 MG PO TABS
650.0000 mg | ORAL_TABLET | ORAL | Status: DC | PRN
  Administered 2018-04-25: 650 mg via ORAL
  Filled 2018-04-25: qty 2

## 2018-04-25 MED ORDER — LEVALBUTEROL HCL 0.63 MG/3ML IN NEBU: 0.6300 mg | INHALATION_SOLUTION | RESPIRATORY_TRACT | Status: DC | PRN

## 2018-04-25 MED ORDER — DILTIAZEM HCL 25 MG/5ML IV SOLN
20.0000 mg | Freq: Once | INTRAVENOUS | Status: AC
  Administered 2018-04-25: 20 mg via INTRAVENOUS
  Filled 2018-04-25: qty 5

## 2018-04-25 MED ORDER — ONDANSETRON HCL 4 MG/2ML IJ SOLN: 4.0000 mg | Freq: Four times a day (QID) | INTRAMUSCULAR | Status: DC | PRN

## 2018-04-25 MED ORDER — SODIUM CHLORIDE 0.9 % IV SOLN: INTRAVENOUS | Status: DC | PRN

## 2018-04-25 MED ORDER — POTASSIUM CHLORIDE CRYS ER 20 MEQ PO TBCR
40.0000 meq | EXTENDED_RELEASE_TABLET | Freq: Once | ORAL | Status: AC
  Administered 2018-04-25: 40 meq via ORAL
  Filled 2018-04-25: qty 2

## 2018-04-25 MED ORDER — NITROGLYCERIN 0.4 MG SL SUBL: 0.4000 mg | SUBLINGUAL_TABLET | SUBLINGUAL | Status: DC | PRN

## 2018-04-25 MED ORDER — IRBESARTAN 150 MG PO TABS
150.0000 mg | ORAL_TABLET | Freq: Every morning | ORAL | Status: DC
  Administered 2018-04-26: 150 mg via ORAL
  Filled 2018-04-25: qty 1

## 2018-04-25 MED ORDER — VITAMIN D 25 MCG (1000 UNIT) PO TABS
2000.0000 [IU] | ORAL_TABLET | Freq: Every morning | ORAL | Status: DC
  Administered 2018-04-26: 2000 [IU] via ORAL
  Filled 2018-04-25: qty 2

## 2018-04-25 NOTE — ED Triage Notes (Signed)
Onset 3 hours ago, tightness in chest, feeling tired, hx Afib.

## 2018-04-25 NOTE — H&P (Signed)
History and Physical    Ana English ZOX:[id number] DOB: 09-04-1950 DOA: 04/25/2018  PCP: Sinda Du, MD   Patient coming from: Home.  I have personally briefly reviewed patient's old medical records in [place]  Chief Complaint: Chest pain.  HPI: Ana English is a 67 y.o. female with medical history significant of paroxysmal A. fib, anemia, asthma, DJD, GERD, hyperlipidemia, hypertension, Paroxysmal SVT, diastolic dysfunction with preserved EF per March 2016 echo at [facility], sleep apnea not on CPAP who is coming to the emergency department with complaints of chest pain pressure-like with no specific radiation associated with mild dyspnea, mild palpitations, nausea, but denies emesis, dizziness or diaphoresis since about 1300 today.  There were no exacerbating or relieving factors. The patient continued to have her chest pain for several hours until she came to the emergency department around 1700.  She denies fever, chills, sore throat, headache, wheezing, hemoptysis, PND, orthopnea (although she says that she sleeps with her head and back reclined) or worsening of lower extremity edema.  She denies abdominal pain, diarrhea, constipation, melena or hematochezia.  Denies dysuria, frequency or hematuria.  Denies polyuria, polydipsia, polyphagia or blurred vision.  ED Course: Initial vital signs temperature 98.2 F, pulse 125, respirations 15, blood pressure 155/86 mmHg and O2 sat 98% on room air.  The patient was given Cardizem 20 mg IVP x1.  I ordered potassium and magnesium supplementation.  EKG shows sinus tachycardia with questionable prolonged QTC.  There was no old tracing to compare to.  Her CBC was normal.  Troponin was normal.  Electrolytes were within normal limits, except for potassium of 3.4 mmol/L.  Renal function was normal.  Magnesium, calcium and phosphorus within normal limits.  A nonfasting glucose level was 106 mg/dL.  Her chest radiograph did not have any  acute cardiopulmonary pathology.  Please see images and full radiology report for further detail.  Review of Systems: As per HPI otherwise 10 point review of systems negative.  Past Medical History:  Diagnosis Date  . A-fib ([place])   . Anemia   . Asthma   . Chest discomfort   . Complication of anesthesia    dysrhythmmia postop- pre atrial fib dx.  Marland Kitchen DJD (degenerative joint disease)   . Dysrhythmia    atrial fib.  Marland Kitchen GERD (gastroesophageal reflux disease)   . Hyperlipidemia   . Hypertension   . Mass on back   . PSVT (paroxysmal supraventricular tachycardia) ([place])   . Shortness of breath dyspnea    with A- fib  . Sleep apnea    rare use of CPAP    Past Surgical History:  Procedure Laterality Date  . CESAREAN SECTION  over 30 years ago   x3  . CHOLECYSTECTOMY  1990  . COLONOSCOPY     Remote     reports that she has never smoked. She has never used smokeless tobacco. She reports that she does not drink alcohol or use drugs.  No Known Allergies  Family History  Problem Relation Age of Onset  . Arthritis Father   . Breast cancer Daughter 62   Prior to Admission medications   Medication Sig Start Date End Date Taking? Authorizing Provider  acetaminophen (TYLENOL) 500 MG tablet Take 500 mg by mouth every 6 (six) hours as needed for mild pain or moderate pain.   Yes [provider]  calcium carbonate (TUMS - DOSED IN MG ELEMENTAL CALCIUM) 500 MG chewable tablet Chew 1 tablet by mouth daily as  needed for indigestion or heartburn.    Yes [provider]  Cholecalciferol (VITAMIN D) 50 MCG (2000 UT) tablet Take 2,000 Units by mouth every morning.   Yes [provider]  etodolac (LODINE) 400 MG tablet Take 400 mg by mouth every morning.    Yes [provider]  irbesartan (AVAPRO) 150 MG tablet Take 150 mg by mouth every morning. 04/20/18  Yes [provider]  levalbuterol (XOPENEX HFA) 45 MCG/ACT inhaler Inhale 2 puffs into the lungs  every 4 (four) hours as needed for wheezing.   Yes [provider]  rivaroxaban (XARELTO) 20 MG TABS tablet Take 20 mg by mouth every morning.    Yes [provider]  verapamil (VERELAN PM) 360 MG 24 hr capsule Take 360 mg by mouth every morning.    Yes [provider]  vitamin B-12 (CYANOCOBALAMIN) 1000 MCG tablet Take 1,000 mcg by mouth daily.     Yes [provider]  nitroGLYCERIN (NITROSTAT) 0.4 MG SL tablet Place 0.4 mg under the tongue every 5 (five) minutes as needed. May repeat for up to 3 doses.     [provider]    Physical Exam: Vitals:   04/25/18 1626 04/25/18 1923 04/25/18 1926 04/25/18 1930  BP: (!) 155/86 130/82  134/85  Pulse: (!) 125  99 94  Resp: 15 (!) 21 18 (!) 21  Temp: 98.2 F (36.8 C)     TempSrc: Oral     SpO2: 98%     Weight:      Height:       Constitutional: NAD, calm, comfortable Eyes: PERRL, lids and conjunctivae normal ENMT: Mucous membranes are moist. Posterior pharynx clear of any exudate or lesions. Neck: normal, supple, no masses, no thyromegaly Respiratory: clear to auscultation bilaterally, no wheezing, no crackles. Normal respiratory effort. No accessory muscle use.  Cardiovascular: Regular rate and rhythm, no murmurs / rubs / gallops. No lower extremity pitting edema.  Positive stage I lymphedema.  2+ pedal pulses. No carotid bruits.  Abdomen: Obese, soft, no tenderness, no masses palpated. No hepatosplenomegaly. Bowel sounds positive.  Musculoskeletal: no clubbing / cyanosis. Good ROM, no contractures. Normal muscle tone.  Skin: no rashes, lesions, ulcers on limited dermatological examination. Neurologic: CN 2-12 grossly intact. Sensation intact, DTR normal. Strength 5/5 in all 4.  Psychiatric: Normal judgment and insight. Alert and oriented x 3. Normal mood.   Labs on Admission: I have personally reviewed following labs and imaging studies  CBC: Recent Labs  Lab 04/25/18 1747  WBC 6.0  HGB  14.4  HCT 45.6  MCV 95.8  PLT 607   Basic Metabolic Panel: Recent Labs  Lab 04/25/18 1747  NA 139  K 3.4*  CL 106  CO2 25  GLUCOSE 106*  BUN 10  CREATININE 0.64  CALCIUM 8.9  MG 2.0  PHOS 2.9   GFR: Estimated Creatinine Clearance: 94 mL/min (by C-G formula based on SCr of 0.64 mg/dL). Liver Function Tests: No results for input(s): AST, ALT, ALKPHOS, BILITOT, PROT, ALBUMIN in the last 168 hours. No results for input(s): LIPASE, AMYLASE in the last 168 hours. No results for input(s): AMMONIA in the last 168 hours. Coagulation Profile: No results for input(s): INR, PROTIME in the last 168 hours. Cardiac Enzymes: Recent Labs  Lab 04/25/18 1747  TROPONINI <0.03   BNP (last 3 results) No results for input(s): PROBNP in the last 8760 hours. HbA1C: No results for input(s): HGBA1C in the last 72 hours. CBG: No results  for input(s): GLUCAP in the last 168 hours. Lipid Profile: No results for input(s): CHOL, HDL, LDLCALC, TRIG, CHOLHDL, LDLDIRECT in the last 72 hours. Thyroid Function Tests: No results for input(s): TSH, T4TOTAL, FREET4, T3FREE, THYROIDAB in the last 72 hours. Anemia Panel: No results for input(s): VITAMINB12, FOLATE, FERRITIN, TIBC, IRON, RETICCTPCT in the last 72 hours. Urine analysis: No results found for: COLORURINE, APPEARANCEUR, [place], [place], GLUCOSEU, [place], [place], KETONESUR, PROTEINUR, UROBILINOGEN, NITRITE, LEUKOCYTESUR  Radiological Exams on Admission: Dg Chest 2 View  Result Date: 04/25/2018 CLINICAL DATA:  Chest tightness since 3 hours ago EXAM: CHEST - 2 VIEW COMPARISON:  05/29/2014 FINDINGS: Normal heart size. Tortuous atherosclerotic aorta. No aneurysm is identified. No acute pulmonary consolidation, effusion or pneumothorax. No acute nor suspicious osseous lesions. IMPRESSION: No active cardiopulmonary disease. Electronically Signed   By: Ashley Royalty M.D.   On: 04/25/2018 17:25    EKG: Independently reviewed.  Vent. rate 122  BPM PR interval 112 ms QRS duration 82 ms QT/QTc 434/618 ms P-R-T axes * 79 33 Sinus tachycardia Otherwise normal ECG possible prolonged QTc No old tracing to compare  Assessment/Plan Principal Problem:   Chest pain Observation/telemetry. Continue supplemental oxygen. On Xarelto. Continue verapamil. Trend troponin level. Check echocardiogram.  Active Problems:   Paroxysmal atrial fibrillation (HCC) CHA?DS?-VASc Score of at least 4. Continue Xarelto 20 mg p.o. daily. Continue verapamil 360 mg p.o. daily. Xopenex for asthma management. GI prophylaxis with Protonix.    Hypertension Continue verapamil. Continue Avapro 150 mg p.o. daily. Monitor BP, HR, renal function and electrolytes.    Hyperlipidemia Not on medical therapy. Continue lifestyle modifications. To be discussed with PCP.    Asthma Supplemental oxygen and bronchodilators as needed.    Hypokalemia Replacing. Magnesium has been supplemented.    Sleep apnea The patient does not use CPAP. Declined to use CPAP in the hospital.    GERD (gastroesophageal reflux disease) Protonix 40 mg p.o. daily.      DVT prophylaxis: Xarelto. Code Status: Full code. Family Communication:  Disposition Plan: Observation for troponin level trending. Consults called: Admission status: Observation/telemetry.   Reubin Milan MD [facility] Pager [phone].  If 7PM-7AM, please contact night-coverage [email or web address] Password [facility]  04/25/2018, 8:34 PM

## 2018-04-25 NOTE — ED Provider Notes (Signed)
[facility] EMERGENCY DEPARTMENT Provider Note   CSN: [id number] Arrival date & time: 04/25/18  1614     History   Chief Complaint Chief Complaint  Patient presents with  . Chest Pain    HPI Ana English is a 67 y.o. female.  HPI  The patient is a 67 year old female, she has a known history of atrial fibrillation, she does not have a history of diabetes but does have high blood pressure.  She notes that she does get short of breath with atrial fibrillation and is currently anticoagulated on Xarelto.  She states that last night she became very nauseated and vomited, since that time she has had chest heaviness shortness of breath and a feeling of the pain radiating to her back.  It is persistent since 1:00 over the last 5 hours.  She denies any history of obstructive cardiac disease, she has had no increased swelling in her legs though she does have some chronic mild swelling.  She does not smoke cigarettes drink alcohol or have any history of exertional chest pain.  Past Medical History:  Diagnosis Date  . A-fib ([place])   . Anemia   . Asthma   . Chest discomfort   . Complication of anesthesia    dysrhythmmia postop- pre atrial fib dx.  Marland Kitchen DJD (degenerative joint disease)   . Dysrhythmia    atrial fib.  Marland Kitchen GERD (gastroesophageal reflux disease)   . Hyperlipidemia   . Hypertension   . Mass on back   . PSVT (paroxysmal supraventricular tachycardia) ([place])   . Shortness of breath dyspnea    with A- fib  . Sleep apnea    rare use of CPAP    Patient Active Problem List   Diagnosis Date Noted  . S/P laparoscopic assisted vaginal hysterectomy (LAVH) 01/15/2015  . Lipoma of back - left side - 8 cm 09/23/2011  . HYPERLIPIDEMIA 06/30/2010  . ASTHMA 06/30/2010  . DEGENERATIVE JOINT DISEASE 06/30/2010  . CHEST PAIN 06/30/2010    Past Surgical History:  Procedure Laterality Date  . CESAREAN SECTION  over 30 years ago   x3  . CHOLECYSTECTOMY  1990  . COLONOSCOPY     Remote      OB History   None      Home Medications    Prior to Admission medications   Medication Sig Start Date End Date Taking? Authorizing Provider  aluminum & magnesium hydroxide (MAALOX) 225-200 MG/5ML suspension Take by mouth as needed.      [provider]  aspirin 81 MG tablet Take 81 mg by mouth daily.      [provider]  calcium carbonate (TUMS - DOSED IN MG ELEMENTAL CALCIUM) 500 MG chewable tablet Chew 1 tablet by mouth daily.      [provider]  etodolac (LODINE) 400 MG tablet Take 400 mg by mouth 2 (two) times daily.    [provider]  levalbuterol Penne Lash HFA) 45 MCG/ACT inhaler Inhale 2 puffs into the lungs every 4 (four) hours as needed for wheezing.    [provider]  nitroGLYCERIN (NITROSTAT) 0.4 MG SL tablet Place 0.4 mg under the tongue every 5 (five) minutes as needed. May repeat for up to 3 doses.     [provider]  omeprazole (PRILOSEC) 40 MG capsule Take 40 mg by mouth daily.    [provider]  rivaroxaban (XARELTO) 20 MG TABS tablet Take 20 mg by mouth daily with supper.    [provider]  valsartan (DIOVAN) 160 MG tablet Take 160 mg by mouth daily.    [provider]  verapamil (VERELAN PM) 360 MG 24 hr capsule Take 360 mg by mouth at bedtime.    [provider]  vitamin B-12 (CYANOCOBALAMIN) 1000 MCG tablet Take 1,000 mcg by mouth daily.      [provider]    Family History Family History  Problem Relation Age of Onset  . Arthritis Father   . Breast cancer Daughter 84    Social History Social History   Tobacco Use  . Smoking status: Never Smoker  . Smokeless tobacco: Never Used  Substance Use Topics  . Alcohol use: No  . Drug use: No     Allergies   Patient has no known allergies.   Review of Systems Review of Systems  All other systems reviewed and are negative.    Physical Exam Updated Vital Signs BP (!) 155/86 (BP Location:  Right Arm)   Pulse (!) 125   Temp 98.2 F (36.8 C) (Oral)   Resp 15   Ht 1.676 m (5\' 6" )   Wt 129.3 kg   SpO2 98%   BMI 46.00 kg/m    Physical Exam  Constitutional: She appears well-developed and well-nourished. No distress.  HENT:  Head: Normocephalic and atraumatic.  Mouth/Throat: Oropharynx is clear and moist. No oropharyngeal exudate.  Eyes: Pupils are equal, round, and reactive to light. Conjunctivae and EOM are normal. Right eye exhibits no discharge. Left eye exhibits no discharge. No scleral icterus.  Neck: Normal range of motion. Neck supple. No JVD present. No thyromegaly present.  Cardiovascular: Regular rhythm, normal heart sounds and intact distal pulses. Exam reveals no gallop and no friction rub.  No murmur heard. Tachycardia, pulse of 120 on exam, regular, good pulses at the radial arteries  Pulmonary/Chest: Effort normal and breath sounds normal. No respiratory distress. She has no wheezes. She has no rales.  Abdominal: Soft. Bowel sounds are normal. She exhibits no distension and no mass. There is no tenderness.  Musculoskeletal: Normal range of motion. She exhibits no edema or tenderness.  Lymphadenopathy:    She has no cervical adenopathy.  Neurological: She is alert. Coordination normal.  Skin: Skin is warm and dry. No rash noted. No erythema.  Psychiatric: She has a normal mood and affect. Her behavior is normal.  Nursing note and vitals reviewed.    ED Treatments / Results  Labs (all labs ordered are listed, but only abnormal results are displayed) Labs Reviewed  CBC  BASIC METABOLIC PANEL  TROPONIN I    EKG EKG Interpretation  Date/Time:  Monday April 25 2018 16:30:13 EST Ventricular Rate:  122 PR Interval:  112 QRS Duration: 82 QT Interval:  434 QTC Calculation: 618 R Axis:   79 Text Interpretation:  Sinus tachycardia Otherwise normal ECG possible prolonged QTc No old tracing to compare Confirmed by Noemi Chapel [phone]) on 04/25/2018  6:32:51 PM   Radiology Dg Chest 2 View  Result Date: 04/25/2018 CLINICAL DATA:  Chest tightness since 3 hours ago EXAM: CHEST - 2 VIEW COMPARISON:  05/29/2014 FINDINGS: Normal heart size. Tortuous atherosclerotic aorta. No aneurysm is identified. No acute pulmonary consolidation, effusion or pneumothorax. No acute nor suspicious osseous lesions. IMPRESSION: No active cardiopulmonary disease. Electronically Signed   By: Ashley Royalty M.D.   On: 04/25/2018 17:25    Procedures Procedures (including critical care time)  Medications Ordered in ED Medications - No data to display   Initial  Impression / Assessment and Plan / ED Course  I have reviewed the triage vital signs and the nursing notes.  Pertinent labs & imaging results that were available during my care of the patient were reviewed by me and considered in my medical decision making (see chart for details).    The patient has an ongoing mild tachycardia, she is already on Xarelto and adequately anticoagulated, I think pulmonary embolism is less likely, this may be related to an underlying atrial flutter or her regular atrial fibrillation, may be related to underlying ischemia as well.  Will attempt rate control while waiting for labs  Cardizem 20mg  with rate reduction by 20 bpm - currently 100 in NSR - repeat EKG ordered,    CXR neg  Labs unremarkable  Heart pathway score 6  Needs admission for ACS r/o - pt agreeable.  D/w hospitalist who will admit  Final Clinical Impressions(s) / ED Diagnoses   Final diagnoses:  Chest heaviness  Tachycardia     Noemi Chapel, MD 04/26/18 1546

## 2018-04-26 ENCOUNTER — Observation Stay (HOSPITAL_BASED_OUTPATIENT_CLINIC_OR_DEPARTMENT_OTHER): Payer: Medicare Other

## 2018-04-26 ENCOUNTER — Encounter (HOSPITAL_COMMUNITY): Payer: Self-pay | Admitting: Student

## 2018-04-26 DIAGNOSIS — R0789 Other chest pain: Secondary | ICD-10-CM | POA: Diagnosis not present

## 2018-04-26 DIAGNOSIS — R079 Chest pain, unspecified: Secondary | ICD-10-CM | POA: Diagnosis not present

## 2018-04-26 DIAGNOSIS — I480 Paroxysmal atrial fibrillation: Secondary | ICD-10-CM | POA: Diagnosis not present

## 2018-04-26 LAB — BASIC METABOLIC PANEL
Anion gap: 7 (ref 5–15)
BUN: 10 mg/dL (ref 8–23)
CHLORIDE: 107 mmol/L (ref 98–111)
CO2: 25 mmol/L (ref 22–32)
Calcium: 8.4 mg/dL — ABNORMAL LOW (ref 8.9–10.3)
Creatinine, Ser: 0.73 mg/dL (ref 0.44–1.00)
GFR calc Af Amer: >60 mL/min (ref >60)
GFR calc non Af Amer: >60 mL/min (ref >60)
Glucose, Bld: 95 mg/dL (ref 70–99)
Potassium: 3.8 mmol/L (ref 3.5–5.1)
Sodium: 139 mmol/L (ref 135–145)

## 2018-04-26 LAB — ECHOCARDIOGRAM COMPLETE
Height: 66 in
WEIGHTICAEL: 4560 [oz_av]

## 2018-04-26 LAB — TROPONIN I: Troponin I: <0.03 ng/mL (ref <0.03)

## 2018-04-26 MED ORDER — ETODOLAC 200 MG PO CAPS
400.0000 mg | ORAL_CAPSULE | Freq: Every day | ORAL | Status: DC
  Administered 2018-04-26: 400 mg via ORAL
  Filled 2018-04-26 (×2): qty 2

## 2018-04-26 MED ORDER — NITROGLYCERIN 0.4 MG SL SUBL: 0.4000 mg | SUBLINGUAL_TABLET | SUBLINGUAL | 12 refills | Status: AC | PRN

## 2018-04-26 MED ORDER — RIVAROXABAN 20 MG PO TABS
20.0000 mg | ORAL_TABLET | Freq: Every day | ORAL | Status: DC
  Administered 2018-04-26: 20 mg via ORAL
  Filled 2018-04-26: qty 1

## 2018-04-26 NOTE — Progress Notes (Signed)
    Echocardiogram resulted and showed no significant abnormalities. Patient and admitting team made aware of results.   [facility] will sign off.   Medication Recommendations: None Follow up as an outpatient: 2-4 week follow-up with her [facility] Cardiologist at [facility].   Signed, Erma Heritage, PA-C 04/26/2018, 11:39 AM Pager: [phone]

## 2018-04-26 NOTE — Progress Notes (Signed)
Ana English discharged Home per MD order.  Discharge instructions reviewed and discussed with the patient, all questions and concerns answered. Copy of instructions and scripts given to patient.  Allergies as of 04/26/2018   No Known Allergies     Medication List    TAKE these medications   acetaminophen 500 MG tablet Commonly known as:  TYLENOL Take 500 mg by mouth every 6 (six) hours as needed for mild pain or moderate pain.   calcium carbonate 500 MG chewable tablet Commonly known as:  TUMS - dosed in mg elemental calcium Chew 1 tablet by mouth daily as needed for indigestion or heartburn.   etodolac 400 MG tablet Commonly known as:  LODINE Take 400 mg by mouth every morning.   irbesartan 150 MG tablet Commonly known as:  AVAPRO Take 150 mg by mouth every morning.   levalbuterol 45 MCG/ACT inhaler Commonly known as:  XOPENEX HFA Inhale 2 puffs into the lungs every 4 (four) hours as needed for wheezing.   nitroGLYCERIN 0.4 MG SL tablet Commonly known as:  NITROSTAT Place 1 tablet (0.4 mg total) under the tongue every 5 (five) minutes as needed for chest pain. What changed:  You were already taking a medication with the same name, and this prescription was added. Make sure you understand how and when to take each.   nitroGLYCERIN 0.4 MG SL tablet Commonly known as:  NITROSTAT Place 0.4 mg under the tongue every 5 (five) minutes as needed. May repeat for up to 3 doses. What changed:  Another medication with the same name was added. Make sure you understand how and when to take each.   rivaroxaban 20 MG Tabs tablet Commonly known as:  XARELTO Take 20 mg by mouth every morning.   verapamil 360 MG 24 hr capsule Commonly known as:  VERELAN PM Take 360 mg by mouth every morning.   vitamin B-12 1000 MCG tablet Commonly known as:  CYANOCOBALAMIN Take 1,000 mcg by mouth daily.   Vitamin D 50 MCG (2000 UT) tablet Take 2,000 Units by mouth every morning.        Patients skin is clean, dry and intact, no evidence of skin break down. IV site discontinued and catheter remains intact. Site without signs and symptoms of complications. Dressing and pressure applied.  Patient ambulated to the elevator,  no distress noted upon discharge.  Ana English 04/26/2018 6:37 PM

## 2018-04-26 NOTE — Progress Notes (Signed)
*  PRELIMINARY RESULTS* Echocardiogram 2D Echocardiogram has been performed.  Ana English 04/26/2018, 9:28 AM

## 2018-04-26 NOTE — Consult Note (Addendum)
Cardiology Consult    Patient ID: Ana English; [id number]; Jun 08, 1950   Admit date: 04/25/2018 Date of Consult: 04/26/2018  Primary Care Provider: Sinda Du, MD Primary Cardiologist: [facility] - Dr. Alycia English  Patient Profile    Ana English is a 67 y.o. female with past medical history of PAF (on Xarelto), HTN, HLD, and OSA who is being seen today for the evaluation of chest pain at the request of Dr. Olevia Bowens.   History of Present Illness    Ana English was last examined by Dr. Alycia English in 11/2016 by review of Care Everywhere and denied any recent chest pain or dyspnea at that time. Did report occasional palpitations. She was planning to undergo knee surgery and was instructed to hold Xarelto prior to her upcoming procedure.  She presented to [facility] ED on 04/25/2018 for evaluation of nausea, vomiting, and chest discomfort. In talking with the patient, she reports developing nausea and vomiting the day prior to admission. Starting yesterday around lunchtime, she developed a discomfort along her entire precordium. Reports that her iWatch noted her heart rate was elevated into the 120's during this timeframe. The discomfort along her chest persisted for over 6 hours which prompted her to come to the ED for further evaluation. Reports she had difficulty taking a deep breath but pain was not worse with positional changes or exertion. No recent orthopnea, PND, or lower extremity edema.  Unaware of any recent sick contacts but reports she was around 66 family members on Thanksgiving day.  She now feels back to baseline and reports her nausea and vomiting has resolved. No recurrent chest discomfort overnight or this morning.   Initial labs show WBC 6.0, Hgb 14.4, platelets 214, Na+ 139, K+ 3.4, and creatinine 0.64. Magneisum 2.0. Initial and cyclic troponin values have been negative thus far. CXR showed no active cardiopulmonary disease. EKG shows sinus tachycardia, heart rate 122, with  nonspecific ST abnormality along the inferior leads. Has maintained NSR on telemetry overnight with HR in the 80's to 90's.    Past Medical History:  Diagnosis Date  . A-fib ([place])   . Anemia   . Asthma   . Chest discomfort   . Complication of anesthesia    dysrhythmmia postop- pre atrial fib dx.  Marland Kitchen DJD (degenerative joint disease)   . Dysrhythmia    atrial fib.  Marland Kitchen GERD (gastroesophageal reflux disease)   . Hyperlipidemia   . Hypertension   . Mass on back   . PSVT (paroxysmal supraventricular tachycardia) ([place])   . Sleep apnea    rare use of CPAP    Past Surgical History:  Procedure Laterality Date  . CESAREAN SECTION  over 30 years ago   x3  . CHOLECYSTECTOMY  1990  . COLONOSCOPY     Remote     Home Medications:  Prior to Admission medications   Medication Sig Start Date End Date Taking? Authorizing Provider  acetaminophen (TYLENOL) 500 MG tablet Take 500 mg by mouth every 6 (six) hours as needed for mild pain or moderate pain.   Yes [provider]  calcium carbonate (TUMS - DOSED IN MG ELEMENTAL CALCIUM) 500 MG chewable tablet Chew 1 tablet by mouth daily as needed for indigestion or heartburn.    Yes [provider]  Cholecalciferol (VITAMIN D) 50 MCG (2000 UT) tablet Take 2,000 Units by mouth every morning.   Yes [provider]  etodolac (LODINE) 400 MG tablet Take 400 mg by mouth every  morning.    Yes [provider]  irbesartan (AVAPRO) 150 MG tablet Take 150 mg by mouth every morning. 04/20/18  Yes [provider]  levalbuterol (XOPENEX HFA) 45 MCG/ACT inhaler Inhale 2 puffs into the lungs every 4 (four) hours as needed for wheezing.   Yes [provider]  rivaroxaban (XARELTO) 20 MG TABS tablet Take 20 mg by mouth every morning.    Yes [provider]  verapamil (VERELAN PM) 360 MG 24 hr capsule Take 360 mg by mouth every morning.    Yes [provider]  vitamin B-12 (CYANOCOBALAMIN) 1000 MCG  tablet Take 1,000 mcg by mouth daily.     Yes [provider]  nitroGLYCERIN (NITROSTAT) 0.4 MG SL tablet Place 0.4 mg under the tongue every 5 (five) minutes as needed. May repeat for up to 3 doses.     [provider]    Inpatient Medications: Scheduled Meds: . cholecalciferol  2,000 Units Oral q morning - 10a  . etodolac  400 mg Oral Daily  . Influenza vac split quadrivalent PF  0.5 mL Intramuscular Tomorrow-1000  . irbesartan  150 mg Oral q morning - 10a  . rivaroxaban  20 mg Oral Q breakfast  . verapamil  360 mg Oral q morning - 10a   Continuous Infusions: . sodium chloride     PRN Meds: sodium chloride, acetaminophen, calcium carbonate, levalbuterol, nitroGLYCERIN, ondansetron (ZOFRAN) IV  Allergies:   No Known Allergies  Social History:   Social History   Socioeconomic History  . Marital status: Widowed    Spouse name: Not on file  . Number of children: Not on file  . Years of education: Not on file  . Highest education level: Not on file  Occupational History  . Occupation: Therapist, sports employed as a Surveyor, mining: [place]  . Financial resource strain: Not on file  . Food insecurity:    Worry: Not on file    Inability: Not on file  . Transportation needs:    Medical: Not on file    Non-medical: Not on file  Tobacco Use  . Smoking status: Never Smoker  . Smokeless tobacco: Never Used  Substance and Sexual Activity  . Alcohol use: No  . Drug use: No  . Sexual activity: Not on file  Lifestyle  . Physical activity:    Days per week: Not on file    Minutes per session: Not on file  . Stress: Not on file  Relationships  . Social connections:    Talks on phone: Not on file    Gets together: Not on file    Attends religious service: Not on file    Active member of club or organization: Not on file    Attends meetings of clubs or organizations: Not on file    Relationship status: Not on file  . Intimate partner  violence:    Fear of current or ex partner: Not on file    Emotionally abused: Not on file    Physically abused: Not on file    Forced sexual activity: Not on file  Other Topics Concern  . Not on file  Social History Narrative   Married with 4 children   No regular exercise     Family History:    Family History  Problem Relation Age of Onset  . Arthritis Father   . Breast cancer Daughter 48      Review of Systems  General:  No chills, fever, night sweats or weight changes.  Cardiovascular:  No dyspnea on exertion, edema, orthopnea, palpitations, paroxysmal nocturnal dyspnea. Positive for chest pain.  Dermatological: No rash, lesions/masses Respiratory: No cough, dyspnea Urologic: No hematuria, dysuria Abdominal:   No diarrhea, bright red blood per rectum, melena, or hematemesis. Positive for nausea and vomiting.  Neurologic:  No visual changes, wkns, changes in mental status. All other systems reviewed and are otherwise negative except as noted above.  Physical Exam/Data    Vitals:   04/25/18 2110 04/25/18 2320 04/26/18 0318 04/26/18 0718  BP: (!) 166/99 132/79 136/80 137/72  Pulse: (!) 101 88 88 80  Resp:  18  19  Temp: 98.6 F (37 C) 98.8 F (37.1 C) 98.3 F (36.8 C) 98.6 F (37 C)  TempSrc: Oral Oral Oral Oral  SpO2: 91% 96% 90% 98%  Weight:      Height:        Intake/Output Summary (Last 24 hours) at 04/26/2018 0828 Last data filed at 04/25/2018 2313 Gross per 24 hour  Intake 285.71 ml  Output -  Net 285.71 ml   Filed Weights   04/25/18 1624  Weight: 129.3 kg   Body mass index is 46 kg/m.   General: Pleasant, African American female appearing in NAD Psych: Normal affect. Neuro: Alert and oriented X 3. Moves all extremities spontaneously. HEENT: Normal  Neck: Supple without bruits or JVD. Lungs:  Resp regular and unlabored, CTA without wheezing or rales. Heart: RRR no s3, s4, 2/6 holosystolic murmur related frommurmur along [place].  Abdomen:  Soft, non-tender, non-distended, BS + x 4.  Extremities: No clubbing, cyanosis or lower extremity edema. DP/PT/Radials 2+ and equal bilaterally.   Labs/Studies     Relevant CV Studies:  Echocardiogram: 07/2014 NORMAL LEFT VENTRICULAR SYSTOLIC FUNCTION WITH MODERATE LVH ELEVATED LA PRESSURES WITH DIASTOLIC DYSFUNCTION NORMAL RIGHT VENTRICULAR SYSTOLIC FUNCTION VALVULAR REGURGITATION: TRIVIAL MR, TRIVIAL TR NO VALVULAR STENOSIS NO PRIOR STUDY FOR COMPARISON  Laboratory Data:  Chemistry Recent Labs  Lab 04/25/18 1747  NA 139  K 3.4*  CL 106  CO2 25  GLUCOSE 106*  BUN 10  CREATININE 0.64  CALCIUM 8.9  GFRNONAA >60  GFRAA >60  ANIONGAP 8    No results for input(s): PROT, ALBUMIN, AST, ALT, ALKPHOS, BILITOT in the last 168 hours. Hematology Recent Labs  Lab 04/25/18 1747  WBC 6.0  RBC 4.76  HGB 14.4  HCT 45.6  MCV 95.8  MCH 30.3  MCHC 31.6  RDW 13.3  PLT 214   Cardiac Enzymes Recent Labs  Lab 04/25/18 1747 04/26/18 0439  TROPONINI <0.03 <0.03   No results for input(s): TROPIPOC in the last 168 hours.  BNPNo results for input(s): BNP, PROBNP in the last 168 hours.  DDimer No results for input(s): DDIMER in the last 168 hours.  Radiology/Studies:  Dg Chest 2 View  Result Date: 04/25/2018 CLINICAL DATA:  Chest tightness since 3 hours ago EXAM: CHEST - 2 VIEW COMPARISON:  05/29/2014 FINDINGS: Normal heart size. Tortuous atherosclerotic aorta. No aneurysm is identified. No acute pulmonary consolidation, effusion or pneumothorax. No acute nor suspicious osseous lesions. IMPRESSION: No active cardiopulmonary disease. Electronically Signed   By: Ashley Royalty M.D.   On: 04/25/2018 17:25     Assessment & Plan    1. Atypical Chest Pain - Developed nausea and vomiting the day prior to admission and noted discomfort along her entire precordium starting yesterday afternoon. Her pain lasted for over 6 hours and was not  worse with exertion or positional  changes. Did notice her pain was exacerbated with deep breathing. She denies any recurrent pain since admission and is unaware of any known history of CAD or family history of CAD. - Initial and cyclic troponin values have been negative thus far with final value pending. EKG on admission showed sinus tachycardia with nonspecific ST abnormalities along the inferior leads and repeat tracing this morning shows NSR with no acute ST abnormalities. Echocardiogram is pending to assess LV function and wall motion. Overall, her presenting symptoms seem atypical for a cardiac etiology in the setting of negative enzymes and her prolonged discomfort. If echocardiogram shows no acute findings, would not anticipate further ischemic testing this admission.   2. Paroxysmal Atrial Fibrillation - Reports occasional palpitations yesterday but nothing resembling her prior episodes of atrial fibrillation. She has maintained normal sinus rhythm on telemetry and heart rate has improved into the 80's to 90's. Continue Verapamil 360mg  daily for rate-control.  - She denies any evidence of active bleeding. Hemoglobin stable at 14.4 on admission. Continue Xarelto for anticoagulation.  3. HTN - BP initially elevated to 155/86, improved to 137/72 on most recent check. Continue PTA Irbesartan and Verapamil.   4. Hypokalemia - K+ 3.4 on admission. Has been replaced with repeat BMET pending.    For questions or updates, please contact [place] Please consult [email or web address] for contact info under Cardiology/STEMI.  Signed, Erma Heritage, PA-C 04/26/2018, 8:28 AM Pager: [phone]   Attending note:  Patient seen and examined.  Case discussed with Ms. Ahmed Prima PA-C.  Ms. Leonides Sake follows with Dr. Alycia English at [facility] for management of paroxysmal atrial fibrillation.  She is on Xarelto for stroke prophylaxis along with long-acting verapamil 360 mg daily.  She states that her atrial fibrillation is overall well controlled, she  has occasional brief breakthrough palpitations, last episode was about 1 month ago.  She is now admitted to the hospital with atypical, pleuritic anterior chest discomfort, occurred after having an episode of nausea and emesis.  Chest discomfort is not exertional and has resolved under observation in the hospital.  He has no known history of ischemic heart disease.  On examination this morning she appears comfortable, no active chest pain.  She is afebrile.  Heart rate is in the 80s in sinus rhythm by telemetry which I personally reviewed.  Systolic pressure ranging 1 30-1 60.  Lungs are clear without labored breathing.  Cardiac exam reveals RRR with soft systolic murmur no gallop.  Lab work shows potassium 3.8, BUN 10, creatinine 0.73, troponin I levels normal x2, hemoglobin 14.4, platelets 214.  I reviewed her recent ECGs showing sinus tachycardia and sinus rhythm, no acute ST segment changes.  Chest x-ray reports no acute cardiopulmonary process.  Episode of atypical chest pain, presently resolved.  No evidence of ACS by cardiac enzymes and ECG shows no acute ST segment changes.  She has been in sinus rhythm under observation.  Plan to follow-up on echocardiogram that has already been obtained, if there are no wall motion abnormalities or reduction in LVEF, no further inpatient cardiac testing is planned.  Would anticipate discharge home on present medical therapy and follow-up with her cardiologist at [facility]  Satira Sark, M.D., F.A.C.C.

## 2018-04-26 NOTE — Progress Notes (Signed)
Subjective: She was admitted with chest pain and has ruled out for acute MI.  She has been seen by cardiology and they feel that if she does okay with echocardiogram today that she could be discharged.  She has no complaints now.  No chest pain no chest tightness no shortness of breath  Objective: Vital signs in last 24 hours: Temp:  [98.2 F (36.8 C)-98.8 F (37.1 C)] 98.6 F (37 C) (12/03 0718) Pulse Rate:  [80-125] 80 (12/03 0718) Resp:  [15-21] 19 (12/03 0718) BP: (130-166)/(72-99) 137/72 (12/03 0718) SpO2:  [90 %-98 %] 98 % (12/03 0718) Weight:  [129.3 kg] 129.3 kg (12/02 1624) Weight change:  Last BM Date: 04/25/18  Intake/Output from previous day: 12/02 0701 - 12/03 0700 In: 285.7 [P.O.:240; IV Piggyback:45.7] Out: -   PHYSICAL EXAM General appearance: alert, cooperative and no distress Resp: clear to auscultation bilaterally Cardio: regular rate and rhythm, S1, S2 normal, no murmur, click, rub or gallop GI: soft, non-tender; bowel sounds normal; no masses,  no organomegaly Extremities: extremities normal, atraumatic, no cyanosis or edema  Lab Results:  Results for orders placed or performed during the hospital encounter of 04/25/18 (from the past 48 hour(s))  Basic metabolic panel     Status: Abnormal   Collection Time: 04/25/18  5:47 PM  Result Value Ref Range   Sodium 139 135 - 145 mmol/L   Potassium 3.4 (L) 3.5 - 5.1 mmol/L   Chloride 106 98 - 111 mmol/L   CO2 25 22 - 32 mmol/L   Glucose, Bld 106 (H) 70 - 99 mg/dL   BUN 10 8 - 23 mg/dL   Creatinine, Ser 0.64 0.44 - 1.00 mg/dL   Calcium 8.9 8.9 - 10.3 mg/dL   GFR calc non Af Amer >60 >60 mL/min   GFR calc Af Amer >60 >60 mL/min   Anion gap 8 5 - 15    Comment: Performed at [facility], [address] [place], [place] [address]  CBC     Status: None   Collection Time: 04/25/18  5:47 PM  Result Value Ref Range   WBC 6.0 4.0 - 10.5 K/uL   RBC 4.76 3.87 - 5.11 MIL/uL   Hemoglobin 14.4 12.0 - 15.0 g/dL   HCT  45.6 36.0 - 46.0 %   MCV 95.8 80.0 - 100.0 fL   MCH 30.3 26.0 - 34.0 pg   MCHC 31.6 30.0 - 36.0 g/dL   RDW 13.3 11.5 - 15.5 %   Platelets 214 150 - 400 K/uL   nRBC 0.0 0.0 - 0.2 %    Comment: Performed at [facility], [address] [place], [place] [address]  Troponin I - ONCE - STAT     Status: None   Collection Time: 04/25/18  5:47 PM  Result Value Ref Range   Troponin I <0.03 <0.03 ng/mL    Comment: Performed at [facility], [address], [place], [place] [address]  Magnesium     Status: None   Collection Time: 04/25/18  5:47 PM  Result Value Ref Range   Magnesium 2.0 1.7 - 2.4 mg/dL    Comment: Performed at [facility], [address] [place], [place] [address]  Phosphorus     Status: None   Collection Time: 04/25/18  5:47 PM  Result Value Ref Range   Phosphorus 2.9 2.5 - 4.6 mg/dL    Comment: Performed at [facility], [address], [place], [place] [address]  Troponin I - [facility]  Status: None   Collection Time: 04/26/18  4:39 AM  Result Value Ref Range   Troponin I <0.03 <0.03 ng/mL    Comment: Performed at [facility], [address] [place], [place] [address]    ABGS No results for input(s): PHART, PO2ART, TCO2, HCO3 in the last 72 hours.  Invalid input(s): PCO2 CULTURES No results found for this or any previous visit (from the past 240 hour(s)). Studies/Results: Dg Chest 2 View  Result Date: 04/25/2018 CLINICAL DATA:  Chest tightness since 3 hours ago EXAM: CHEST - 2 VIEW COMPARISON:  05/29/2014 FINDINGS: Normal heart size. Tortuous atherosclerotic aorta. No aneurysm is identified. No acute pulmonary consolidation, effusion or pneumothorax. No acute nor suspicious osseous lesions. IMPRESSION: No active cardiopulmonary disease. Electronically Signed   By: Ashley Royalty M.D.   On: 04/25/2018 17:25    Medications:  Prior to Admission:  Medications Prior to Admission  Medication Sig Dispense Refill Last Dose  . acetaminophen (TYLENOL) 500 MG tablet  Take 500 mg by mouth every 6 (six) hours as needed for mild pain or moderate pain.   Past Month at Unknown time  . calcium carbonate (TUMS - DOSED IN MG ELEMENTAL CALCIUM) 500 MG chewable tablet Chew 1 tablet by mouth daily as needed for indigestion or heartburn.    04/25/2018 at Unknown time  . Cholecalciferol (VITAMIN D) 50 MCG (2000 UT) tablet Take 2,000 Units by mouth every morning.   04/25/2018 at Unknown time  . etodolac (LODINE) 400 MG tablet Take 400 mg by mouth every morning.    04/25/2018 at Unknown time  . irbesartan (AVAPRO) 150 MG tablet Take 150 mg by mouth every morning.  3 04/25/2018 at Unknown time  . levalbuterol (XOPENEX HFA) 45 MCG/ACT inhaler Inhale 2 puffs into the lungs every 4 (four) hours as needed for wheezing.   Past Month at Unknown time  . rivaroxaban (XARELTO) 20 MG TABS tablet Take 20 mg by mouth every morning.    04/25/2018 at 900a  . verapamil (VERELAN PM) 360 MG 24 hr capsule Take 360 mg by mouth every morning.    04/25/2018 at Unknown time  . vitamin B-12 (CYANOCOBALAMIN) 1000 MCG tablet Take 1,000 mcg by mouth daily.     Past Month at Unknown time  . nitroGLYCERIN (NITROSTAT) 0.4 MG SL tablet Place 0.4 mg under the tongue every 5 (five) minutes as needed. May repeat for up to 3 doses.    Not Taking at Unknown time   Scheduled: . cholecalciferol  2,000 Units Oral q morning - 10a  . etodolac  400 mg Oral Daily  . Influenza vac split quadrivalent PF  0.5 mL Intramuscular Tomorrow-1000  . irbesartan  150 mg Oral q morning - 10a  . rivaroxaban  20 mg Oral Q breakfast  . verapamil  360 mg Oral q morning - 10a   Continuous: . sodium chloride     IDP:OEUMPN chloride, acetaminophen, calcium carbonate, levalbuterol, nitroGLYCERIN, ondansetron (ZOFRAN) IV  Assesment: She was admitted with chest pain.  She is known to have paroxysmal atrial fib.  She has ruled out for acute MI. Principal Problem:   Chest pain Active Problems:   Hyperlipidemia   Asthma   Hypokalemia    Sleep apnea   GERD (gastroesophageal reflux disease)   Paroxysmal atrial fibrillation ([place])   Hypertension    Plan: Probable discharge later today    LOS: 0 days   Lakyra Tippins L 04/26/2018, 9:14 AM

## 2018-05-03 NOTE — Discharge Summary (Signed)
Physician Discharge Summary  Patient ID: Ana English MRN: [id number] DOB/AGE: 07/03/1950 67 y.o. Primary Care Physician:Torez Beauregard, Percell Miller, MD Admit date: 04/25/2018 Discharge date: 05/03/2018    Discharge Diagnoses:   Principal Problem:   Chest pain Active Problems:   Hyperlipidemia   Asthma   Hypokalemia   Sleep apnea   GERD (gastroesophageal reflux disease)   Paroxysmal atrial fibrillation ([place])   Hypertension   Allergies as of 04/26/2018   No Known Allergies     Medication List    TAKE these medications   acetaminophen 500 MG tablet Commonly known as:  TYLENOL Take 500 mg by mouth every 6 (six) hours as needed for mild pain or moderate pain.   calcium carbonate 500 MG chewable tablet Commonly known as:  TUMS - dosed in mg elemental calcium Chew 1 tablet by mouth daily as needed for indigestion or heartburn.   etodolac 400 MG tablet Commonly known as:  LODINE Take 400 mg by mouth every morning.   irbesartan 150 MG tablet Commonly known as:  AVAPRO Take 150 mg by mouth every morning.   levalbuterol 45 MCG/ACT inhaler Commonly known as:  XOPENEX HFA Inhale 2 puffs into the lungs every 4 (four) hours as needed for wheezing.   nitroGLYCERIN 0.4 MG SL tablet Commonly known as:  NITROSTAT Place 1 tablet (0.4 mg total) under the tongue every 5 (five) minutes as needed for chest pain. What changed:  You were already taking a medication with the same name, and this prescription was added. Make sure you understand how and when to take each.   nitroGLYCERIN 0.4 MG SL tablet Commonly known as:  NITROSTAT Place 0.4 mg under the tongue every 5 (five) minutes as needed. May repeat for up to 3 doses. What changed:  Another medication with the same name was added. Make sure you understand how and when to take each.   rivaroxaban 20 MG Tabs tablet Commonly known as:  XARELTO Take 20 mg by mouth every morning.   verapamil 360 MG 24 hr capsule Commonly known as:  VERELAN  PM Take 360 mg by mouth every morning.   vitamin B-12 1000 MCG tablet Commonly known as:  CYANOCOBALAMIN Take 1,000 mcg by mouth daily.   Vitamin D 50 MCG (2000 UT) tablet Take 2,000 Units by mouth every morning.       Discharged Condition: Improved    Consults: Cardiology  Significant Diagnostic Studies: Dg Chest 2 View  Result Date: 04/25/2018 CLINICAL DATA:  Chest tightness since 3 hours ago EXAM: CHEST - 2 VIEW COMPARISON:  05/29/2014 FINDINGS: Normal heart size. Tortuous atherosclerotic aorta. No aneurysm is identified. No acute pulmonary consolidation, effusion or pneumothorax. No acute nor suspicious osseous lesions. IMPRESSION: No active cardiopulmonary disease. Electronically Signed   By: Ashley Royalty M.D.   On: 04/25/2018 17:25    Lab Results: Basic Metabolic Panel: No results for input(s): NA, K, CL, CO2, GLUCOSE, BUN, CREATININE, CALCIUM, MG, PHOS in the last 72 hours. Liver Function Tests: No results for input(s): AST, ALT, ALKPHOS, BILITOT, PROT, ALBUMIN in the last 72 hours.   CBC: No results for input(s): WBC, NEUTROABS, HGB, HCT, MCV, PLT in the last 72 hours.  No results found for this or any previous visit (from the past 240 hour(s)).   Hospital Course: This is a 67 year old who came to the hospital with chest discomfort.  She is known to have atrial fib and has a positive family history.  She ruled out for MI and had  echocardiogram that showed no acute changes no wall motion abnormality.  Cardiology consultation was obtained and it was felt that after her testing was negative that she was okay for discharge.  She will be followed up as an outpatient.  Discharge Exam: Blood pressure 137/72, pulse 80, temperature 98.6 F (37 C), temperature source Oral, resp. rate 19, height 5\' 6"  (1.676 m), weight 129.3 kg, SpO2 98 %. She is awake and alert.  Chest is clear.  Heart is regular.  Disposition: Home she will follow-up in my office and with  cardiology      Signed: Keriana Sarsfield L   05/03/2018, 8:08 AM

## 2018-06-20 DIAGNOSIS — Z6841 Body Mass Index (BMI) 40.0 and over, adult: Secondary | ICD-10-CM | POA: Diagnosis not present

## 2018-06-20 DIAGNOSIS — Z124 Encounter for screening for malignant neoplasm of cervix: Secondary | ICD-10-CM | POA: Diagnosis not present

## 2018-06-20 DIAGNOSIS — Z01419 Encounter for gynecological examination (general) (routine) without abnormal findings: Secondary | ICD-10-CM | POA: Diagnosis not present

## 2018-12-01 ENCOUNTER — Other Ambulatory Visit: Payer: Medicare Other

## 2018-12-01 ENCOUNTER — Other Ambulatory Visit: Payer: Self-pay

## 2018-12-01 DIAGNOSIS — R6889 Other general symptoms and signs: Secondary | ICD-10-CM | POA: Diagnosis not present

## 2018-12-01 DIAGNOSIS — Z20822 Contact with and (suspected) exposure to covid-19: Secondary | ICD-10-CM

## 2018-12-05 LAB — NOVEL CORONAVIRUS, NAA: SARS-CoV-2, NAA: NOT DETECTED

## 2018-12-06 DIAGNOSIS — R0602 Shortness of breath: Secondary | ICD-10-CM | POA: Diagnosis not present

## 2018-12-06 DIAGNOSIS — Z0000 Encounter for general adult medical examination without abnormal findings: Secondary | ICD-10-CM | POA: Diagnosis not present

## 2018-12-06 DIAGNOSIS — E669 Obesity, unspecified: Secondary | ICD-10-CM | POA: Diagnosis not present

## 2018-12-06 DIAGNOSIS — I10 Essential (primary) hypertension: Secondary | ICD-10-CM | POA: Diagnosis not present

## 2018-12-06 DIAGNOSIS — F419 Anxiety disorder, unspecified: Secondary | ICD-10-CM | POA: Diagnosis not present

## 2018-12-06 DIAGNOSIS — I4891 Unspecified atrial fibrillation: Secondary | ICD-10-CM | POA: Diagnosis not present

## 2018-12-06 DIAGNOSIS — G4733 Obstructive sleep apnea (adult) (pediatric): Secondary | ICD-10-CM | POA: Diagnosis not present

## 2018-12-06 DIAGNOSIS — G4730 Sleep apnea, unspecified: Secondary | ICD-10-CM | POA: Diagnosis not present

## 2018-12-06 DIAGNOSIS — M159 Polyosteoarthritis, unspecified: Secondary | ICD-10-CM | POA: Diagnosis not present

## 2018-12-22 DIAGNOSIS — M7661 Achilles tendinitis, right leg: Secondary | ICD-10-CM | POA: Diagnosis not present

## 2018-12-22 DIAGNOSIS — M79671 Pain in right foot: Secondary | ICD-10-CM | POA: Diagnosis not present

## 2019-01-31 DIAGNOSIS — I471 Supraventricular tachycardia: Secondary | ICD-10-CM | POA: Diagnosis not present

## 2019-01-31 DIAGNOSIS — I480 Paroxysmal atrial fibrillation: Secondary | ICD-10-CM | POA: Diagnosis not present

## 2019-02-02 ENCOUNTER — Other Ambulatory Visit: Payer: Self-pay | Admitting: Pulmonary Disease

## 2019-02-02 DIAGNOSIS — Z23 Encounter for immunization: Secondary | ICD-10-CM | POA: Diagnosis not present

## 2019-02-02 DIAGNOSIS — Z1231 Encounter for screening mammogram for malignant neoplasm of breast: Secondary | ICD-10-CM

## 2019-02-14 ENCOUNTER — Other Ambulatory Visit: Payer: Self-pay | Admitting: Gastroenterology

## 2019-02-14 DIAGNOSIS — K219 Gastro-esophageal reflux disease without esophagitis: Secondary | ICD-10-CM | POA: Diagnosis not present

## 2019-02-14 DIAGNOSIS — K5730 Diverticulosis of large intestine without perforation or abscess without bleeding: Secondary | ICD-10-CM | POA: Diagnosis not present

## 2019-02-14 DIAGNOSIS — R110 Nausea: Secondary | ICD-10-CM | POA: Diagnosis not present

## 2019-02-14 DIAGNOSIS — R140 Abdominal distension (gaseous): Secondary | ICD-10-CM | POA: Diagnosis not present

## 2019-02-21 DIAGNOSIS — I480 Paroxysmal atrial fibrillation: Secondary | ICD-10-CM | POA: Diagnosis not present

## 2019-02-21 DIAGNOSIS — I471 Supraventricular tachycardia: Secondary | ICD-10-CM | POA: Diagnosis not present

## 2019-02-28 ENCOUNTER — Other Ambulatory Visit: Payer: Self-pay

## 2019-02-28 ENCOUNTER — Encounter (HOSPITAL_COMMUNITY)
Admission: RE | Admit: 2019-02-28 | Discharge: 2019-02-28 | Disposition: A | Discharge status: Home / Self Care | Payer: Medicare Other | Source: Ambulatory Visit | Attending: Gastroenterology | Admitting: Gastroenterology

## 2019-02-28 DIAGNOSIS — R110 Nausea: Secondary | ICD-10-CM | POA: Diagnosis not present

## 2019-02-28 DIAGNOSIS — K219 Gastro-esophageal reflux disease without esophagitis: Secondary | ICD-10-CM | POA: Diagnosis not present

## 2019-02-28 MED ORDER — TECHNETIUM TC 99M SULFUR COLLOID
2.1600 | Freq: Once | INTRAVENOUS | Status: AC | PRN
  Administered 2019-02-28: 07:00:00 2.16 via ORAL

## 2019-02-28 MED ORDER — TECHNETIUM TC 99M SULFUR COLLOID: 2.1600 | Freq: Once | INTRAVENOUS | Status: DC | PRN

## 2019-03-21 ENCOUNTER — Ambulatory Visit: Payer: Medicare Other

## 2019-05-10 ENCOUNTER — Ambulatory Visit: Payer: Medicare Other

## 2019-05-29 DIAGNOSIS — Z23 Encounter for immunization: Secondary | ICD-10-CM | POA: Diagnosis not present

## 2019-06-28 ENCOUNTER — Ambulatory Visit
Admission: RE | Admit: 2019-06-28 | Discharge: 2019-06-28 | Disposition: A | Discharge status: Home / Self Care | Payer: Medicare Other | Source: Ambulatory Visit | Attending: Pulmonary Disease | Admitting: Pulmonary Disease

## 2019-06-28 ENCOUNTER — Other Ambulatory Visit: Payer: Self-pay

## 2019-06-28 DIAGNOSIS — Z1231 Encounter for screening mammogram for malignant neoplasm of breast: Secondary | ICD-10-CM

## 2019-07-04 DIAGNOSIS — M159 Polyosteoarthritis, unspecified: Secondary | ICD-10-CM | POA: Diagnosis not present

## 2019-07-04 DIAGNOSIS — I4891 Unspecified atrial fibrillation: Secondary | ICD-10-CM | POA: Diagnosis not present

## 2019-07-04 DIAGNOSIS — Z6841 Body Mass Index (BMI) 40.0 and over, adult: Secondary | ICD-10-CM | POA: Diagnosis not present

## 2019-07-04 DIAGNOSIS — Z299 Encounter for prophylactic measures, unspecified: Secondary | ICD-10-CM | POA: Diagnosis not present

## 2019-07-04 DIAGNOSIS — I10 Essential (primary) hypertension: Secondary | ICD-10-CM | POA: Diagnosis not present

## 2019-07-04 DIAGNOSIS — R609 Edema, unspecified: Secondary | ICD-10-CM | POA: Diagnosis not present

## 2019-07-19 DIAGNOSIS — Z1331 Encounter for screening for depression: Secondary | ICD-10-CM | POA: Diagnosis not present

## 2019-07-19 DIAGNOSIS — Z1211 Encounter for screening for malignant neoplasm of colon: Secondary | ICD-10-CM | POA: Diagnosis not present

## 2019-07-19 DIAGNOSIS — E8940 Asymptomatic postprocedural ovarian failure: Secondary | ICD-10-CM | POA: Diagnosis not present

## 2019-07-19 DIAGNOSIS — Z299 Encounter for prophylactic measures, unspecified: Secondary | ICD-10-CM | POA: Diagnosis not present

## 2019-07-19 DIAGNOSIS — Z1339 Encounter for screening examination for other mental health and behavioral disorders: Secondary | ICD-10-CM | POA: Diagnosis not present

## 2019-07-19 DIAGNOSIS — I4891 Unspecified atrial fibrillation: Secondary | ICD-10-CM | POA: Diagnosis not present

## 2019-07-19 DIAGNOSIS — Z0000 Encounter for general adult medical examination without abnormal findings: Secondary | ICD-10-CM | POA: Diagnosis not present

## 2019-07-19 DIAGNOSIS — Z6841 Body Mass Index (BMI) 40.0 and over, adult: Secondary | ICD-10-CM | POA: Diagnosis not present

## 2019-07-19 DIAGNOSIS — Z7189 Other specified counseling: Secondary | ICD-10-CM | POA: Diagnosis not present

## 2019-07-19 DIAGNOSIS — I10 Essential (primary) hypertension: Secondary | ICD-10-CM | POA: Diagnosis not present

## 2019-08-07 DIAGNOSIS — E2839 Other primary ovarian failure: Secondary | ICD-10-CM | POA: Diagnosis not present

## 2019-08-11 DIAGNOSIS — M545 Low back pain: Secondary | ICD-10-CM | POA: Diagnosis not present

## 2019-10-06 DIAGNOSIS — I10 Essential (primary) hypertension: Secondary | ICD-10-CM | POA: Diagnosis not present

## 2019-10-06 DIAGNOSIS — I471 Supraventricular tachycardia: Secondary | ICD-10-CM | POA: Diagnosis not present

## 2019-10-12 DIAGNOSIS — Z01419 Encounter for gynecological examination (general) (routine) without abnormal findings: Secondary | ICD-10-CM | POA: Diagnosis not present

## 2019-10-12 DIAGNOSIS — Z6841 Body Mass Index (BMI) 40.0 and over, adult: Secondary | ICD-10-CM | POA: Diagnosis not present

## 2019-10-12 DIAGNOSIS — B009 Herpesviral infection, unspecified: Secondary | ICD-10-CM | POA: Diagnosis not present

## 2019-10-24 DIAGNOSIS — I471 Supraventricular tachycardia: Secondary | ICD-10-CM | POA: Diagnosis not present

## 2019-12-09 DIAGNOSIS — H524 Presbyopia: Secondary | ICD-10-CM | POA: Diagnosis not present

## 2019-12-09 DIAGNOSIS — H15012 Anterior scleritis, left eye: Secondary | ICD-10-CM | POA: Diagnosis not present

## 2019-12-09 DIAGNOSIS — H35373 Puckering of macula, bilateral: Secondary | ICD-10-CM | POA: Diagnosis not present

## 2020-01-08 DIAGNOSIS — I5032 Chronic diastolic (congestive) heart failure: Secondary | ICD-10-CM | POA: Diagnosis not present

## 2020-01-08 DIAGNOSIS — I480 Paroxysmal atrial fibrillation: Secondary | ICD-10-CM | POA: Diagnosis not present

## 2020-01-08 DIAGNOSIS — G4733 Obstructive sleep apnea (adult) (pediatric): Secondary | ICD-10-CM | POA: Diagnosis not present

## 2020-01-08 DIAGNOSIS — E876 Hypokalemia: Secondary | ICD-10-CM | POA: Diagnosis not present

## 2020-01-08 DIAGNOSIS — I10 Essential (primary) hypertension: Secondary | ICD-10-CM | POA: Diagnosis not present

## 2020-01-15 DIAGNOSIS — I10 Essential (primary) hypertension: Secondary | ICD-10-CM | POA: Diagnosis not present

## 2020-01-21 DIAGNOSIS — Z23 Encounter for immunization: Secondary | ICD-10-CM | POA: Diagnosis not present

## 2020-01-26 DIAGNOSIS — R799 Abnormal finding of blood chemistry, unspecified: Secondary | ICD-10-CM | POA: Diagnosis not present

## 2020-01-26 DIAGNOSIS — I5030 Unspecified diastolic (congestive) heart failure: Secondary | ICD-10-CM | POA: Diagnosis not present

## 2020-01-26 DIAGNOSIS — I110 Hypertensive heart disease with heart failure: Secondary | ICD-10-CM | POA: Diagnosis not present

## 2020-01-26 DIAGNOSIS — Z6841 Body Mass Index (BMI) 40.0 and over, adult: Secondary | ICD-10-CM | POA: Diagnosis not present

## 2020-01-26 DIAGNOSIS — E669 Obesity, unspecified: Secondary | ICD-10-CM | POA: Diagnosis not present

## 2020-01-26 DIAGNOSIS — I4891 Unspecified atrial fibrillation: Secondary | ICD-10-CM | POA: Diagnosis not present

## 2020-02-17 ENCOUNTER — Emergency Department (HOSPITAL_COMMUNITY): Payer: Medicare Other

## 2020-02-17 ENCOUNTER — Other Ambulatory Visit: Payer: Self-pay

## 2020-02-17 ENCOUNTER — Encounter (HOSPITAL_COMMUNITY): Payer: Self-pay | Admitting: Emergency Medicine

## 2020-02-17 ENCOUNTER — Observation Stay (HOSPITAL_COMMUNITY)
Admission: EM | Admit: 2020-02-17 | Discharge: 2020-02-19 | Disposition: A | Discharge status: Home / Self Care | Payer: Medicare Other | Attending: Family Medicine | Admitting: Family Medicine

## 2020-02-17 DIAGNOSIS — J45909 Unspecified asthma, uncomplicated: Secondary | ICD-10-CM | POA: Insufficient documentation

## 2020-02-17 DIAGNOSIS — Z7901 Long term (current) use of anticoagulants: Secondary | ICD-10-CM | POA: Diagnosis not present

## 2020-02-17 DIAGNOSIS — Z79899 Other long term (current) drug therapy: Secondary | ICD-10-CM | POA: Insufficient documentation

## 2020-02-17 DIAGNOSIS — R079 Chest pain, unspecified: Secondary | ICD-10-CM | POA: Diagnosis not present

## 2020-02-17 DIAGNOSIS — Z20822 Contact with and (suspected) exposure to covid-19: Secondary | ICD-10-CM | POA: Insufficient documentation

## 2020-02-17 DIAGNOSIS — I10 Essential (primary) hypertension: Secondary | ICD-10-CM | POA: Diagnosis not present

## 2020-02-17 DIAGNOSIS — R002 Palpitations: Secondary | ICD-10-CM | POA: Diagnosis present

## 2020-02-17 DIAGNOSIS — I4891 Unspecified atrial fibrillation: Secondary | ICD-10-CM | POA: Diagnosis not present

## 2020-02-17 DIAGNOSIS — I517 Cardiomegaly: Secondary | ICD-10-CM | POA: Diagnosis not present

## 2020-02-17 DIAGNOSIS — J90 Pleural effusion, not elsewhere classified: Secondary | ICD-10-CM | POA: Diagnosis not present

## 2020-02-17 MED ORDER — DILTIAZEM LOAD VIA INFUSION
10.0000 mg | Freq: Once | INTRAVENOUS | Status: AC
  Administered 2020-02-17: 10 mg via INTRAVENOUS
  Filled 2020-02-17: qty 10

## 2020-02-17 MED ORDER — DILTIAZEM HCL-DEXTROSE 125-5 MG/125ML-% IV SOLN (PREMIX)
5.0000 mg/h | INTRAVENOUS | Status: DC
  Administered 2020-02-17: 5 mg/h via INTRAVENOUS
  Filled 2020-02-17: qty 125

## 2020-02-17 NOTE — ED Triage Notes (Signed)
Pt states she feels like her HR is irregular. Pt states she started to feel uncomfortable pressure in her chest at 2045.

## 2020-02-18 DIAGNOSIS — I4891 Unspecified atrial fibrillation: Secondary | ICD-10-CM

## 2020-02-18 LAB — COMPREHENSIVE METABOLIC PANEL
ALT: 10 U/L (ref 0–44)
AST: 16 U/L (ref 15–41)
Albumin: 3.6 g/dL (ref 3.5–5.0)
Alkaline Phosphatase: 90 U/L (ref 38–126)
Anion gap: 11 (ref 5–15)
BUN: 21 mg/dL (ref 8–23)
CO2: 23 mmol/L (ref 22–32)
Calcium: 8.8 mg/dL — ABNORMAL LOW (ref 8.9–10.3)
Chloride: 102 mmol/L (ref 98–111)
Creatinine, Ser: 1.06 mg/dL — ABNORMAL HIGH (ref 0.44–1.00)
GFR calc Af Amer: >60 mL/min (ref >60)
GFR calc non Af Amer: 54 mL/min — ABNORMAL LOW (ref >60)
Glucose, Bld: 101 mg/dL — ABNORMAL HIGH (ref 70–99)
Potassium: 3.8 mmol/L (ref 3.5–5.1)
Sodium: 136 mmol/L (ref 135–145)
Total Bilirubin: 0.6 mg/dL (ref 0.3–1.2)
Total Protein: 6.6 g/dL (ref 6.5–8.1)

## 2020-02-18 LAB — BASIC METABOLIC PANEL
Anion gap: 9 (ref 5–15)
BUN: 21 mg/dL (ref 8–23)
CO2: 26 mmol/L (ref 22–32)
Calcium: 9.1 mg/dL (ref 8.9–10.3)
Chloride: 102 mmol/L (ref 98–111)
Creatinine, Ser: 1.1 mg/dL — ABNORMAL HIGH (ref 0.44–1.00)
GFR calc Af Amer: 59 mL/min — ABNORMAL LOW (ref >60)
GFR calc non Af Amer: 51 mL/min — ABNORMAL LOW (ref >60)
Glucose, Bld: 102 mg/dL — ABNORMAL HIGH (ref 70–99)
Potassium: 3.9 mmol/L (ref 3.5–5.1)
Sodium: 137 mmol/L (ref 135–145)

## 2020-02-18 LAB — CBC
HCT: 41.8 % (ref 36.0–46.0)
Hemoglobin: 13.4 g/dL (ref 12.0–15.0)
MCH: 31.5 pg (ref 26.0–34.0)
MCHC: 32.1 g/dL (ref 30.0–36.0)
MCV: 98.1 fL (ref 80.0–100.0)
Platelets: 237 10*3/uL (ref 150–400)
RBC: 4.26 MIL/uL (ref 3.87–5.11)
RDW: 12.5 % (ref 11.5–15.5)
WBC: 5.9 10*3/uL (ref 4.0–10.5)
nRBC: 0 % (ref 0.0–0.2)

## 2020-02-18 LAB — MAGNESIUM: Magnesium: 2.1 mg/dL (ref 1.7–2.4)

## 2020-02-18 LAB — CBC WITH DIFFERENTIAL/PLATELET
Abs Immature Granulocytes: 0.02 10*3/uL (ref 0.00–0.07)
Basophils Absolute: 0 10*3/uL (ref 0.0–0.1)
Basophils Relative: 1 %
Eosinophils Absolute: 0.1 10*3/uL (ref 0.0–0.5)
Eosinophils Relative: 2 %
HCT: 40.5 % (ref 36.0–46.0)
Hemoglobin: 13.1 g/dL (ref 12.0–15.0)
Immature Granulocytes: 0 %
Lymphocytes Relative: 29 %
Lymphs Abs: 1.7 10*3/uL (ref 0.7–4.0)
MCH: 31.6 pg (ref 26.0–34.0)
MCHC: 32.3 g/dL (ref 30.0–36.0)
MCV: 97.6 fL (ref 80.0–100.0)
Monocytes Absolute: 0.6 10*3/uL (ref 0.1–1.0)
Monocytes Relative: 10 %
Neutro Abs: 3.4 10*3/uL (ref 1.7–7.7)
Neutrophils Relative %: 58 %
Platelets: 249 10*3/uL (ref 150–400)
RBC: 4.15 MIL/uL (ref 3.87–5.11)
RDW: 12.4 % (ref 11.5–15.5)
WBC: 5.9 10*3/uL (ref 4.0–10.5)
nRBC: 0 % (ref 0.0–0.2)

## 2020-02-18 LAB — RESPIRATORY PANEL BY RT PCR (FLU A&B, COVID)
Influenza A by PCR: NEGATIVE
Influenza B by PCR: NEGATIVE
SARS Coronavirus 2 by RT PCR: NEGATIVE

## 2020-02-18 LAB — TROPONIN I (HIGH SENSITIVITY)
Troponin I (High Sensitivity): 4 ng/L (ref <18)
Troponin I (High Sensitivity): 4 ng/L (ref <18)

## 2020-02-18 LAB — TSH: TSH: 0.701 u[IU]/mL (ref 0.350–4.500)

## 2020-02-18 LAB — HIV ANTIBODY (ROUTINE TESTING W REFLEX): HIV Screen 4th Generation wRfx: NONREACTIVE

## 2020-02-18 MED ORDER — SPIRONOLACTONE 25 MG PO TABS
25.0000 mg | ORAL_TABLET | Freq: Every day | ORAL | Status: DC
  Administered 2020-02-19: 25 mg via ORAL
  Filled 2020-02-18 (×3): qty 1

## 2020-02-18 MED ORDER — RIVAROXABAN 20 MG PO TABS
20.0000 mg | ORAL_TABLET | Freq: Every morning | ORAL | Status: DC
  Administered 2020-02-18 – 2020-02-19 (×2): 20 mg via ORAL
  Filled 2020-02-18 (×2): qty 1

## 2020-02-18 MED ORDER — OXYCODONE HCL 5 MG PO TABS: 5.0000 mg | ORAL_TABLET | ORAL | Status: DC | PRN

## 2020-02-18 MED ORDER — ACETAMINOPHEN 650 MG RE SUPP: 650.0000 mg | Freq: Four times a day (QID) | RECTAL | Status: DC | PRN

## 2020-02-18 MED ORDER — POLYETHYLENE GLYCOL 3350 17 G PO PACK: 17.0000 g | PACK | Freq: Every day | ORAL | Status: DC | PRN

## 2020-02-18 MED ORDER — ONDANSETRON HCL 4 MG PO TABS: 4.0000 mg | ORAL_TABLET | Freq: Four times a day (QID) | ORAL | Status: DC | PRN

## 2020-02-18 MED ORDER — ATORVASTATIN CALCIUM 40 MG PO TABS: 80.0000 mg | ORAL_TABLET | Freq: Every day | ORAL | Status: DC

## 2020-02-18 MED ORDER — ONDANSETRON HCL 4 MG/2ML IJ SOLN: 4.0000 mg | Freq: Four times a day (QID) | INTRAMUSCULAR | Status: DC | PRN

## 2020-02-18 MED ORDER — ALBUTEROL SULFATE (2.5 MG/3ML) 0.083% IN NEBU: 2.5000 mg | INHALATION_SOLUTION | RESPIRATORY_TRACT | Status: DC | PRN

## 2020-02-18 MED ORDER — ACETAMINOPHEN 325 MG PO TABS
650.0000 mg | ORAL_TABLET | Freq: Once | ORAL | Status: AC
  Administered 2020-02-18: 650 mg via ORAL
  Filled 2020-02-18: qty 2

## 2020-02-18 MED ORDER — TORSEMIDE 20 MG PO TABS
20.0000 mg | ORAL_TABLET | Freq: Two times a day (BID) | ORAL | Status: DC
  Administered 2020-02-19: 20 mg via ORAL
  Filled 2020-02-18 (×5): qty 1

## 2020-02-18 MED ORDER — DILTIAZEM HCL 30 MG PO TABS
30.0000 mg | ORAL_TABLET | Freq: Four times a day (QID) | ORAL | Status: DC
  Administered 2020-02-18 – 2020-02-19 (×2): 30 mg via ORAL
  Filled 2020-02-18 (×3): qty 1

## 2020-02-18 MED ORDER — ACETAMINOPHEN 325 MG PO TABS
650.0000 mg | ORAL_TABLET | Freq: Four times a day (QID) | ORAL | Status: DC | PRN
  Administered 2020-02-19: 650 mg via ORAL
  Filled 2020-02-18: qty 2

## 2020-02-18 MED ORDER — VERAPAMIL HCL ER 360 MG PO CP24: 360.0000 mg | ORAL_CAPSULE | Freq: Every morning | ORAL | Status: DC

## 2020-02-18 MED ORDER — ATORVASTATIN CALCIUM 40 MG PO TABS
80.0000 mg | ORAL_TABLET | Freq: Every day | ORAL | Status: DC
  Administered 2020-02-19: 80 mg via ORAL
  Filled 2020-02-18: qty 2

## 2020-02-18 MED ORDER — POTASSIUM CHLORIDE CRYS ER 20 MEQ PO TBCR
10.0000 meq | EXTENDED_RELEASE_TABLET | Freq: Every day | ORAL | Status: DC
  Administered 2020-02-19: 10 meq via ORAL
  Filled 2020-02-18 (×4): qty 1

## 2020-02-18 MED ORDER — METOPROLOL TARTRATE 5 MG/5ML IV SOLN: 5.0000 mg | Freq: Four times a day (QID) | INTRAVENOUS | Status: DC | PRN

## 2020-02-18 MED ORDER — IRBESARTAN 150 MG PO TABS
150.0000 mg | ORAL_TABLET | Freq: Every morning | ORAL | Status: DC
  Administered 2020-02-18 – 2020-02-19 (×2): 150 mg via ORAL
  Filled 2020-02-18 (×4): qty 1

## 2020-02-18 NOTE — Care Management Obs Status (Signed)
[facility] [facility] NOTIFICATION   Patient Details  Name: Ana English MRN: [id number] Date of Birth: 12-Feb-1951   Medicare Observation Status Notification Given:  Yes    Sherie Don, LCSW 02/18/2020, 12:27 PM

## 2020-02-18 NOTE — H&P (Signed)
TRH H&P    Patient Demographics:    Ana English, is a 69 y.o. female  MRN: [id number]  DOB - [birth year]  Admit Date - 02/17/2020  Referring MD/NP/PA: Betsey Holiday  Outpatient Primary MD for the patient is Bayes, Colonel Bald, PA-C  Patient coming from: Home  Chief complaint- palpitations   HPI:    Sol Odor  is a 69 y.o. female, and history of sleep apnea, paroxysmal supraventricular tachycardia, paroxysmal atrial fibrillation, hypertension, hyperlipidemia, GERD, and more presents to the ED with a chief complaint of palpitations.  Patient reports that she started feeling palpitations at 8 PM.  They were intermittent.  She waited until 11:00 before she checked with her Apple Watch and saw that she was in A. fib.  Patient reports she had associated chest tightness that was also intermittent and came and went with the palpitations.  Palpitations lasted for few minutes at a time.  She had some shortness of breath that last for a few seconds at a time.  Patient had no nausea.  She did not break out into a sweat.  Patient reports that she has a history of paroxysmal atrial fibrillation.  She can only feel when she goes in and out of rhythm.  She said usually when she goes out of rhythm it only lasts a minute.  The last time that she was in A. fib long up to be hospitalized was in 2013.  Patient reports that her home medications are Xarelto for anticoagulation and she reports verapamil for rhythm control.  She denies ever being on a beta-blocker.  Patient reports that she has not had any upper respiratory symptoms or other illness symptoms recently.  Patient reports that she was on a CPAP 20 years ago, but has not been compliant in the last couple decades.  Patient is going for a sleep study on October 6.  In the ED Temperature 97.9, heart rate up to 126-there are some documented heart rates as low as 44 by ED provider reports  that those are incorrectly documented, blood pressure as low as 86/71 on the Cardizem drip, but as high as 160/106 at presentation.  White blood cell count is 5.9.  Troponin is 4 x2. Chest x-ray shows low lung volumes with peribronchial thickening EKG has a heart rate of 128 A. fib QTc 434 10 mg Cardizem bolus in ED did not convert patient Cardizem drip started Respiratory panel pending     Review of systems:    In addition to the HPI above,  No Fever-chills, No Headache, No changes with Vision or hearing, No problems swallowing food or Liquids, Admits to chest tightness and shortness of breath as well as palpitations No Abdominal pain, No Nausea or Vomiting, bowel movements are regular, No Blood in stool or Urine, No dysuria, No new skin rashes or bruises, No new joints pains-aches,  No new weakness, tingling, numbness in any extremity, No recent weight gain or loss, No polyuria, polydypsia or polyphagia, No significant Mental Stressors.  All other systems reviewed and are negative.  Past History of the following :    Past Medical History:  Diagnosis Date  . A-fib ([place])   . Anemia   . Asthma   . Chest discomfort   . Complication of anesthesia    dysrhythmmia postop- pre atrial fib dx.  Marland Kitchen DJD (degenerative joint disease)   . Dysrhythmia    atrial fib.  Marland Kitchen GERD (gastroesophageal reflux disease)   . Hyperlipidemia   . Hypertension   . Mass on back   . PSVT (paroxysmal supraventricular tachycardia) ([place])   . Sleep apnea    rare use of CPAP      Past Surgical History:  Procedure Laterality Date  . CESAREAN SECTION  over 30 years ago   x3  . CHOLECYSTECTOMY  1990  . COLONOSCOPY     Remote      Social History:      Social History   Tobacco Use  . Smoking status: Never Smoker  . Smokeless tobacco: Never Used  Substance Use Topics  . Alcohol use: No       Family History :     Family History  Problem Relation Age of Onset  . Arthritis Father     . Breast cancer Daughter 74      Home Medications:   Prior to Admission medications   Medication Sig Start Date End Date Taking? Authorizing Provider  acetaminophen (TYLENOL) 500 MG tablet Take 500 mg by mouth every 6 (six) hours as needed for mild pain or moderate pain.    [provider]  calcium carbonate (TUMS - DOSED IN MG ELEMENTAL CALCIUM) 500 MG chewable tablet Chew 1 tablet by mouth daily as needed for indigestion or heartburn.     [provider]  Cholecalciferol (VITAMIN D) 50 MCG (2000 UT) tablet Take 2,000 Units by mouth every morning.    [provider]  etodolac (LODINE) 400 MG tablet Take 400 mg by mouth every morning.     [provider]  irbesartan (AVAPRO) 150 MG tablet Take 150 mg by mouth every morning. 04/20/18   [provider]  levalbuterol Penne Lash HFA) 45 MCG/ACT inhaler Inhale 2 puffs into the lungs every 4 (four) hours as needed for wheezing.    [provider]  nitroGLYCERIN (NITROSTAT) 0.4 MG SL tablet Place 0.4 mg under the tongue every 5 (five) minutes as needed. May repeat for up to 3 doses.     [provider]  nitroGLYCERIN (NITROSTAT) 0.4 MG SL tablet Place 1 tablet (0.4 mg total) under the tongue every 5 (five) minutes as needed for chest pain. 04/26/18   Sinda Du, MD  rivaroxaban (XARELTO) 20 MG TABS tablet Take 20 mg by mouth every morning.     [provider]  verapamil (VERELAN PM) 360 MG 24 hr capsule Take 360 mg by mouth every morning.     [provider]  vitamin B-12 (CYANOCOBALAMIN) 1000 MCG tablet Take 1,000 mcg by mouth daily.      [provider]     Allergies:    No Known Allergies   Physical Exam:   Vitals  Blood pressure 95/76, pulse 78, temperature 97.9 F (36.6 C), temperature source Oral, resp. rate (!) 26, height 5\' 6"  (1.676 m), weight 129.3 kg, SpO2 99 %.  1.  General: Alert, resting comfortably in bed with head of bed  elevated  2. Psychiatric: Mood and behavior normal for situation Seems to have reasonable insight and healthcare  3. Neurologic: Cranial nerves II through  XII are grossly intact, moves all 4 extremities voluntarily, at baseline, no focal deficits on limited exam  4. HEENMT:  Head is atraumatic, normocephalic, pupils are reactive to light, sclera are clear, mucous membranes are moist, trachea is midline  5. Respiratory : Lungs are clear to auscultation bilaterally  6. Cardiovascular : Heart rate is tachycardic, rhythm is irregularly irregular, occasional PVC, no murmur rub or gallop  7. Gastrointestinal:  Abdomen is obese, soft, nondistended, nontender to palpation  8. Skin:  No acute lesions on limited skin exam  9.Musculoskeletal:  Trace edema, no acute deformity    Data Review:    CBC Recent Labs  Lab 02/18/20 0054  WBC 5.9  HGB 13.4  HCT 41.8  PLT 237  MCV 98.1  MCH 31.5  MCHC 32.1  RDW 12.5   ------------------------------------------------------------------------------------------------------------------  Results for orders placed or performed during the hospital encounter of 02/17/20 (from the past 48 hour(s))  Basic metabolic panel     Status: Abnormal   Collection Time: 02/18/20 12:54 AM  Result Value Ref Range   Sodium 137 135 - 145 mmol/L   Potassium 3.9 3.5 - 5.1 mmol/L   Chloride 102 98 - 111 mmol/L   CO2 26 22 - 32 mmol/L   Glucose, Bld 102 (H) 70 - 99 mg/dL    Comment: Glucose reference range applies only to samples taken after fasting for at least 8 hours.   BUN 21 8 - 23 mg/dL   Creatinine, Ser 1.10 (H) 0.44 - 1.00 mg/dL   Calcium 9.1 8.9 - 10.3 mg/dL   GFR calc non Af Amer 51 (L) >60 mL/min   GFR calc Af Amer 59 (L) >60 mL/min   Anion gap 9 5 - 15    Comment: Performed at [facility], [address], [place], [place] [address]  CBC     Status: None   Collection Time: 02/18/20 12:54 AM  Result Value Ref Range   WBC 5.9 4.0 - 10.5 K/uL    RBC 4.26 3.87 - 5.11 MIL/uL   Hemoglobin 13.4 12.0 - 15.0 g/dL   HCT 41.8 36 - 46 %   MCV 98.1 80.0 - 100.0 fL   MCH 31.5 26.0 - 34.0 pg   MCHC 32.1 30.0 - 36.0 g/dL   RDW 12.5 11.5 - 15.5 %   Platelets 237 150 - 400 K/uL   nRBC 0.0 0.0 - 0.2 %    Comment: Performed at [facility], [address] [place], [place] [address]  Troponin I (High Sensitivity)     Status: None   Collection Time: 02/18/20 12:54 AM  Result Value Ref Range   Troponin I (High Sensitivity) 4 <18 ng/L    Comment: (NOTE) Elevated high sensitivity troponin I (hsTnI) values and significant  changes across serial measurements may suggest ACS but many other  chronic and acute conditions are known to elevate hsTnI results.  Refer to the "Links" section for chest pain algorithms and additional  guidance. Performed at [facility], [address], [place], [place] [address]   Troponin I (High Sensitivity)     Status: None   Collection Time: 02/18/20  3:04 AM  Result Value Ref Range   Troponin I (High Sensitivity) 4 <18 ng/L    Comment: (NOTE) Elevated high sensitivity troponin I (hsTnI) values and significant  changes across serial measurements may suggest ACS but many other  chronic and acute conditions are known to elevate hsTnI results.  Refer to the "Links" section for chest  pain algorithms and additional  guidance. Performed at [facility], [address] [place], [place] [address]     Chemistries  Recent Labs  Lab 02/18/20 0054  NA 137  K 3.9  CL 102  CO2 26  GLUCOSE 102*  BUN 21  CREATININE 1.10*  CALCIUM 9.1   ------------------------------------------------------------------------------------------------------------------  ------------------------------------------------------------------------------------------------------------------ GFR: Estimated Creatinine Clearance: 66.5 mL/min (A) (by C-G formula based on SCr of 1.1 mg/dL (H)). Liver Function Tests: No results for input(s): AST,  ALT, ALKPHOS, BILITOT, PROT, ALBUMIN in the last 168 hours. No results for input(s): LIPASE, AMYLASE in the last 168 hours. No results for input(s): AMMONIA in the last 168 hours. Coagulation Profile: No results for input(s): INR, PROTIME in the last 168 hours. Cardiac Enzymes: No results for input(s): CKTOTAL, CKMB, CKMBINDEX, TROPONINI in the last 168 hours. BNP (last 3 results) No results for input(s): PROBNP in the last 8760 hours. HbA1C: No results for input(s): HGBA1C in the last 72 hours. CBG: No results for input(s): GLUCAP in the last 168 hours. Lipid Profile: No results for input(s): CHOL, HDL, LDLCALC, TRIG, CHOLHDL, LDLDIRECT in the last 72 hours. Thyroid Function Tests: No results for input(s): TSH, T4TOTAL, FREET4, T3FREE, THYROIDAB in the last 72 hours. Anemia Panel: No results for input(s): VITAMINB12, FOLATE, FERRITIN, TIBC, IRON, RETICCTPCT in the last 72 hours.  --------------------------------------------------------------------------------------------------------------- Urine analysis: No results found for: COLORURINE, APPEARANCEUR, LABSPEC, PHURINE, GLUCOSEU, HGBUR, BILIRUBINUR, KETONESUR, PROTEINUR, UROBILINOGEN, NITRITE, LEUKOCYTESUR    Imaging Results:    DG Chest 2 View  Result Date: 02/17/2020 CLINICAL DATA:  Chest pain. EXAM: CHEST - 2 VIEW COMPARISON:  Radiograph 04/25/2018 FINDINGS: Lung volumes are low. Borderline cardiomegaly unchanged from prior exam. Unchanged mediastinal contours. There is peribronchial thickening. No focal airspace disease. No pneumothorax or pleural effusion. No acute osseous abnormalities are seen. Degenerative change in the spine. IMPRESSION: Low lung volumes with peribronchial thickening. Electronically Signed   By: Keith Rake M.D.   On: 02/17/2020 23:22    My personal review of EKG: Rhythm Afib, Rate 120 /min, QTc 434 ,no Acute ST changes   Assessment & Plan:    Active Problems:   Atrial fibrillation with RVR  (HCC)   1. Afib with RVR 1. EKG = HR 120 Afib with RVR 2. History of paroxysmal Afib 3. Last hospitalization for Afib was 2013 per patient report 4. CXR = low lung volumes, w/peribronchial thickening - could be pulm edema in the setting of CHF 5. Patient does not drink 6. She does have a history of OSA which is the most likely etiology. Sleep study oct 6th 7. Bolus of cardizem did not convert her - patient started on cardizem drip. 8. HR improved to 100-120 2. HTN 1. BP as high as 160/106 2. Continue Cardizem drip 3. Continue home BP medications 3. OSA 1. Sleep study Oct 6th 4. CHF 1. Continue home medications   DVT Prophylaxis-   xarelto - SCDs   AM Labs Ordered, also please review Full Orders  Family Communication: No family at bedside Code Status:  Full  Admission status: Observation  Time spent in minutes : [place] DO

## 2020-02-18 NOTE — ED Notes (Signed)
Pt was given a lunch tray

## 2020-02-18 NOTE — Progress Notes (Signed)
Patient is a 53-year female with history of SVT, paroxysmal A. fib on anticoagulation, hypertension, hyperlipidemia, GERD who presents to the emergency department with complaints of palpitations, chest tightness.  When she presented to the emergency department she was in A. fib with RVR.  Also hypertensive.  She was admitted for the management of A. fib with RVR.  She was started on Cardizem drip. Patient follows with cardiology at [facility].  Most of the time she stays in normal sinus rhythm.  She takes verapamil for rate control.  On examination she was comfortable.  She denied any chest pain or shortness of breath.  Her heart rate was in the range of 90-110s and she was in A. fib.  Her blood pressure was soft but stable.  We will continue Cardizem drip for now.  When her heart rate is more stable, we will change the IV Cardizem to oral.  Most likely will stop her verapamil.  Hopefully she will convert to sinus rhythm, if not we will consult cardiology tomorrow for possible consideration of cardioversion.  Patient seen and by Dr. Orlin Hilding this morning.  I agree with her assessment and plan.

## 2020-02-18 NOTE — ED Notes (Signed)
Pt removed EKG leads, BP cuff, and pulse oximeter. Refuses to wear it.

## 2020-02-18 NOTE — ED Provider Notes (Signed)
[facility] EMERGENCY DEPARTMENT Provider Note   CSN: [id number] Arrival date & time: 02/17/20  2226     History Chief Complaint  Patient presents with  . Chest Pain    Ana English is a 69 y.o. female.  Patient presents to the emergency department with irregular heartbeat that started a couple of hours ago.  Patient reports a history of atrial fibrillation but has not had any episodes in some years.  She reports a slight discomfort in her chest associated with the irregular heartbeat but no chest pain.  She is breathing comfortably.  Patient is on Xarelto and has not missed any doses.        Past Medical History:  Diagnosis Date  . A-fib ([place])   . Anemia   . Asthma   . Chest discomfort   . Complication of anesthesia    dysrhythmmia postop- pre atrial fib dx.  Marland Kitchen DJD (degenerative joint disease)   . Dysrhythmia    atrial fib.  Marland Kitchen GERD (gastroesophageal reflux disease)   . Hyperlipidemia   . Hypertension   . Mass on back   . PSVT (paroxysmal supraventricular tachycardia) ([place])   . Sleep apnea    rare use of CPAP    Patient Active Problem List   Diagnosis Date Noted  . Chest pain 04/25/2018  . Hypokalemia 04/25/2018  . Sleep apnea 04/25/2018  . GERD (gastroesophageal reflux disease) 04/25/2018  . Paroxysmal atrial fibrillation ([place]) 04/25/2018  . Hypertension 04/25/2018  . S/P laparoscopic assisted vaginal hysterectomy (LAVH) 01/15/2015  . Lipoma of back - left side - 8 cm 09/23/2011  . Hyperlipidemia 06/30/2010  . Asthma 06/30/2010  . DEGENERATIVE JOINT DISEASE 06/30/2010  . CHEST PAIN 06/30/2010    Past Surgical History:  Procedure Laterality Date  . CESAREAN SECTION  over 30 years ago   x3  . CHOLECYSTECTOMY  1990  . COLONOSCOPY     Remote     OB History   No obstetric history on file.     Family History  Problem Relation Age of Onset  . Arthritis Father   . Breast cancer Daughter 44    Social History   Tobacco Use  . Smoking status:  Never Smoker  . Smokeless tobacco: Never Used  Substance Use Topics  . Alcohol use: No  . Drug use: No    Home Medications Prior to Admission medications   Medication Sig Start Date End Date Taking? Authorizing Provider  acetaminophen (TYLENOL) 500 MG tablet Take 500 mg by mouth every 6 (six) hours as needed for mild pain or moderate pain.    [provider]  calcium carbonate (TUMS - DOSED IN MG ELEMENTAL CALCIUM) 500 MG chewable tablet Chew 1 tablet by mouth daily as needed for indigestion or heartburn.     [provider]  Cholecalciferol (VITAMIN D) 50 MCG (2000 UT) tablet Take 2,000 Units by mouth every morning.    [provider]  etodolac (LODINE) 400 MG tablet Take 400 mg by mouth every morning.     [provider]  irbesartan (AVAPRO) 150 MG tablet Take 150 mg by mouth every morning. 04/20/18   [provider]  levalbuterol Penne Lash HFA) 45 MCG/ACT inhaler Inhale 2 puffs into the lungs every 4 (four) hours as needed for wheezing.    [provider]  nitroGLYCERIN (NITROSTAT) 0.4 MG SL tablet Place 0.4 mg under the tongue every 5 (five) minutes as needed. May repeat for up to 3 doses.  [provider]  nitroGLYCERIN (NITROSTAT) 0.4 MG SL tablet Place 1 tablet (0.4 mg total) under the tongue every 5 (five) minutes as needed for chest pain. 04/26/18   Sinda Du, MD  rivaroxaban (XARELTO) 20 MG TABS tablet Take 20 mg by mouth every morning.     [provider]  verapamil (VERELAN PM) 360 MG 24 hr capsule Take 360 mg by mouth every morning.     [provider]  vitamin B-12 (CYANOCOBALAMIN) 1000 MCG tablet Take 1,000 mcg by mouth daily.      [provider]    Allergies    Patient has no known allergies.  Review of Systems   Review of Systems  Cardiovascular: Positive for palpitations.  All other systems reviewed and are negative.   Physical Exam Updated Vital Signs BP (!) 147/83    Pulse (!) 49   Temp 97.9 F (36.6 C) (Oral)   Resp 19   Ht 5\' 6"  (1.676 m)   Wt 129.3 kg   SpO2 95%   BMI 46.00 kg/m   Physical Exam Vitals and nursing note reviewed.  Constitutional:      General: She is not in acute distress.    Appearance: Normal appearance. She is well-developed.  HENT:     Head: Normocephalic and atraumatic.     Right Ear: Hearing normal.     Left Ear: Hearing normal.     Nose: Nose normal.  Eyes:     Conjunctiva/sclera: Conjunctivae normal.     Pupils: Pupils are equal, round, and reactive to light.  Cardiovascular:     Rate and Rhythm: Tachycardia present. Rhythm irregular.     Heart sounds: S1 normal and S2 normal. No murmur heard.  No friction rub. No gallop.   Pulmonary:     Effort: Pulmonary effort is normal. No tachypnea or respiratory distress.     Breath sounds: Normal breath sounds.  Chest:     Chest wall: No tenderness.  Abdominal:     General: Bowel sounds are normal.     Palpations: Abdomen is soft.     Tenderness: There is no abdominal tenderness. There is no guarding or rebound. Negative signs include Murphy's sign and McBurney's sign.     Hernia: No hernia is present.  Musculoskeletal:        General: Normal range of motion.     Cervical back: Normal range of motion and neck supple.  Skin:    General: Skin is warm and dry.     Findings: No rash.  Neurological:     Mental Status: She is alert and oriented to person, place, and time.     GCS: GCS eye subscore is 4. GCS verbal subscore is 5. GCS motor subscore is 6.     Cranial Nerves: No cranial nerve deficit.     Sensory: No sensory deficit.     Coordination: Coordination normal.  Psychiatric:        Speech: Speech normal.        Behavior: Behavior normal.        Thought Content: Thought content normal.     ED Results / Procedures / Treatments   Labs (all labs ordered are listed, but only abnormal results are displayed) Labs Reviewed  BASIC METABOLIC PANEL - Abnormal;  Notable for the following components:      Result Value   Glucose, Bld 102 (*)    Creatinine, Ser 1.10 (*)    GFR calc non Af Amer 51 (*)  GFR calc Af Amer 59 (*)    All other components within normal limits  CBC  TROPONIN I (HIGH SENSITIVITY)  TROPONIN I (HIGH SENSITIVITY)    EKG None  Radiology DG Chest 2 View  Result Date: 02/17/2020 CLINICAL DATA:  Chest pain. EXAM: CHEST - 2 VIEW COMPARISON:  Radiograph 04/25/2018 FINDINGS: Lung volumes are low. Borderline cardiomegaly unchanged from prior exam. Unchanged mediastinal contours. There is peribronchial thickening. No focal airspace disease. No pneumothorax or pleural effusion. No acute osseous abnormalities are seen. Degenerative change in the spine. IMPRESSION: Low lung volumes with peribronchial thickening. Electronically Signed   By: Keith Rake M.D.   On: 02/17/2020 23:22    Procedures Procedures (including critical care time)  Medications Ordered in ED Medications  diltiazem (CARDIZEM) 1 mg/mL load via infusion 10 mg (10 mg Intravenous Bolus from Bag 02/17/20 2347)    And  diltiazem (CARDIZEM) 125 mg in dextrose 5% 125 mL (1 mg/mL) infusion (5 mg/hr Intravenous New Bag/Given 02/17/20 2348)  acetaminophen (TYLENOL) tablet 650 mg (650 mg Oral Given 02/18/20 0154)    ED Course  I have reviewed the triage vital signs and the nursing notes.  Pertinent labs & imaging results that were available during my care of the patient were reviewed by me and considered in my medical decision making (see chart for details).    MDM Rules/Calculators/A&P                          Patient presents to the emergency department for evaluation of heart palpitations.  Patient does have a history of paroxysmal atrial fibrillation.  She is currently anticoagulated on Xarelto.  Heart rate was as high as the 140s at arrival.  She was placed on a Cardizem drip and heart rate improved but still in the 100-120 range.  Blood pressures have dropped  into the 80 and 90 systolic range on the drip and therefore cannot be titrated any higher.  She was monitored for a number of hours and did not convert.  At this point I had a lengthy conversation with her about the possibility of cardioversion.  She has had a cardioversion 1 time many years ago and does not wish to pursue this currently.  She reports that in the past she has been admitted to the hospital and converted to sinus rhythm on her own and would like to be admitted for further monitoring.  CHA2DS2/VAS Stroke Risk Points  Current as of 44 minutes ago     3 >= 2 Points: High Risk  1 - 1.99 Points: Medium Risk  0 Points: Low Risk    Last Change: N/A      Details    This score determines the patient's risk of having a stroke if the  patient has atrial fibrillation.       Points Metrics  0 Has Congestive Heart Failure:  No    Current as of 44 minutes ago  0 Has Vascular Disease:  No    Current as of 44 minutes ago  1 Has Hypertension:  Yes    Current as of 44 minutes ago  1 Age:  55    Current as of 44 minutes ago  0 Has Diabetes:  No    Current as of 44 minutes ago  0 Had Stroke:  No  Had TIA:  No  Had thromboembolism:  No    Current as of 44 minutes ago  1 Female:  Yes    Current as of 44 minutes ago      CRITICAL CARE Performed by: Orpah Greek   Total critical care time: 30 minutes  Critical care time was exclusive of separately billable procedures and treating other patients.  Critical care was necessary to treat or prevent imminent or life-threatening deterioration.  Critical care was time spent personally by me on the following activities: development of treatment plan with patient and/or surrogate as well as nursing, discussions with consultants, evaluation of patient's response to treatment, examination of patient, obtaining history from patient or surrogate, ordering and performing treatments and interventions, ordering and review of laboratory studies,  ordering and review of radiographic studies, pulse oximetry and re-evaluation of patient's condition.    Final Clinical Impression(s) / ED Diagnoses Final diagnoses:  Atrial fibrillation with RVR [facility])    Rx / DC Orders ED Discharge Orders    None       Bethene Hankinson, Gwenyth Allegra, MD 02/18/20 [phone]

## 2020-02-19 DIAGNOSIS — I4891 Unspecified atrial fibrillation: Secondary | ICD-10-CM | POA: Diagnosis not present

## 2020-02-19 NOTE — ED Notes (Addendum)
Consulted hospitalist concerning disposition and possible discharge. Hospitalist reported would assess patient and consider disposition options at that time. Charge RN aware.

## 2020-02-21 DIAGNOSIS — I10 Essential (primary) hypertension: Secondary | ICD-10-CM | POA: Diagnosis not present

## 2020-02-21 DIAGNOSIS — I5032 Chronic diastolic (congestive) heart failure: Secondary | ICD-10-CM | POA: Diagnosis not present

## 2020-02-21 DIAGNOSIS — E782 Mixed hyperlipidemia: Secondary | ICD-10-CM | POA: Diagnosis not present

## 2020-02-21 DIAGNOSIS — G4733 Obstructive sleep apnea (adult) (pediatric): Secondary | ICD-10-CM | POA: Diagnosis not present

## 2020-02-21 DIAGNOSIS — I471 Supraventricular tachycardia: Secondary | ICD-10-CM | POA: Diagnosis not present

## 2020-02-21 DIAGNOSIS — I480 Paroxysmal atrial fibrillation: Secondary | ICD-10-CM | POA: Diagnosis not present

## 2020-02-28 DIAGNOSIS — I10 Essential (primary) hypertension: Secondary | ICD-10-CM | POA: Diagnosis not present

## 2020-02-28 DIAGNOSIS — G4733 Obstructive sleep apnea (adult) (pediatric): Secondary | ICD-10-CM | POA: Diagnosis not present

## 2020-02-29 DIAGNOSIS — I480 Paroxysmal atrial fibrillation: Secondary | ICD-10-CM | POA: Diagnosis not present

## 2020-03-01 NOTE — Discharge Summary (Signed)
Physician Discharge Summary  Ana MARTINE OXB:[id number] DOB: 1951/04/09 DOA: 02/17/2020  PCP: Tobi Bastos, PA-C  Admit date: 02/17/2020 Discharge date: 03/01/2020  Admitted From: Home Disposition:  Home  Discharge Condition:Stable CODE STATUS:FULL Diet recommendation: Heart Healthy  Brief/Interim Summary:  Patient is a 70-year female with history of SVT, paroxysmal A. fib on anticoagulation, hypertension, hyperlipidemia, GERD who presents to the emergency department with complaints of palpitations, chest tightness.  When she presented to the emergency department she was in A. fib with RVR.  Also hypertensive.  She was admitted for the management of A. fib with RVR.  She was started on Cardizem drip. Patient follows with cardiology at [facility].  Most of the time she stays in normal sinus rhythm.  She takes verapamil for rate control. On presentation,her heart rate was in the range of 90-110s and she was in A. fib.  Her blood pressure was soft but stable.   The next morning, she spontaneously converted to normal sinus rhythm. Cardizem stopped. She was hemodynamically stable for discharge. We planned to resume verapamil on discharge and and we recommended follow-up with her own cardiologist within a week.  Marland Kitchen Discharge Diagnoses:  Active Problems:   Atrial fibrillation with RVR [facility])    Discharge Instructions  Discharge Instructions    Diet - low sodium heart healthy   Complete by: As directed    Discharge instructions   Complete by: As directed    1)Please continue your home medications. 2)Please follow up with your cardiologist within a week 3)Follow up with your PCP in 1-2 weeks   Increase activity slowly   Complete by: As directed      Allergies as of 02/19/2020   No Known Allergies     Medication List    TAKE these medications   acetaminophen 500 MG tablet Commonly known as: TYLENOL Take 500 mg by mouth every 6 (six) hours as needed for mild pain or moderate pain.    atorvastatin 80 MG tablet Commonly known as: LIPITOR Take 80 mg by mouth daily.   calcium carbonate 500 MG chewable tablet Commonly known as: TUMS - dosed in mg elemental calcium Chew 1 tablet by mouth daily as needed for indigestion or heartburn.   CALCIUM-VITAMIN D PO Take 1 tablet by mouth daily. Calcium is 1000mg    etodolac 400 MG tablet Commonly known as: LODINE Take 400 mg by mouth daily as needed (Arthritis).   irbesartan 150 MG tablet Commonly known as: AVAPRO Take 75 mg by mouth every morning.   levalbuterol 45 MCG/ACT inhaler Commonly known as: XOPENEX HFA Inhale 2 puffs into the lungs every 4 (four) hours as needed for wheezing.   nitroGLYCERIN 0.4 MG SL tablet Commonly known as: NITROSTAT Place 1 tablet (0.4 mg total) under the tongue every 5 (five) minutes as needed for chest pain.   omeprazole 20 MG capsule Commonly known as: PRILOSEC Take 20 mg by mouth daily.   potassium chloride 10 MEQ tablet Commonly known as: KLOR-CON Take 10 mEq by mouth daily.   rivaroxaban 20 MG Tabs tablet Commonly known as: XARELTO Take 20 mg by mouth every morning.   spironolactone 25 MG tablet Commonly known as: ALDACTONE Take 25 mg by mouth daily.   torsemide 20 MG tablet Commonly known as: DEMADEX Take 20 mg by mouth in the morning and at bedtime.   verapamil 360 MG 24 hr capsule Commonly known as: VERELAN PM Take 300 mg by mouth every morning.   vitamin B-12 1000 MCG  tablet Commonly known as: CYANOCOBALAMIN Take 1,000 mcg by mouth daily.       Follow-up Information    Tobi Bastos, PA-C. Schedule an appointment as soon as possible for a visit in 1 week(s).   Specialty: Internal Medicine Contact information: [address] [place] [address] [phone]              No Known Allergies  Consultations:  None   Procedures/Studies: DG Chest 2 View  Result Date: 02/17/2020 CLINICAL DATA:  Chest pain. EXAM: CHEST - 2 VIEW COMPARISON:   Radiograph 04/25/2018 FINDINGS: Lung volumes are low. Borderline cardiomegaly unchanged from prior exam. Unchanged mediastinal contours. There is peribronchial thickening. No focal airspace disease. No pneumothorax or pleural effusion. No acute osseous abnormalities are seen. Degenerative change in the spine. IMPRESSION: Low lung volumes with peribronchial thickening. Electronically Signed   By: Keith Rake M.D.   On: 02/17/2020 23:22       Subjective: Patient seen and examined at the bedside on the day of the discharge. Medically stable,on  normal sinus rhythm  Discharge Exam: Vitals:   02/19/20 0900 02/19/20 0915  BP: 131/76   Pulse:  83  Resp:    Temp:    SpO2:  100%   Vitals:   02/19/20 0800 02/19/20 0830 02/19/20 0900 02/19/20 0915  BP: 105/87 110/81 131/76   Pulse:    83  Resp: (!) 22 19    Temp:      TempSrc:      SpO2:    100%  Weight:      Height:        General: Pt is alert, awake, not in acute distress Cardiovascular: RRR, S1/S2 +, no rubs, no gallops Respiratory: CTA bilaterally, no wheezing, no rhonchi Abdominal: Soft, NT, ND, bowel sounds + Extremities: no edema, no cyanosis    The results of significant diagnostics from this hospitalization (including imaging, microbiology, ancillary and laboratory) are listed below for reference.     Microbiology: No results found for this or any previous visit (from the past 240 hour(s)).   Labs: BNP (last 3 results) No results for input(s): BNP in the last 8760 hours. Basic Metabolic Panel: No results for input(s): NA, K, CL, CO2, GLUCOSE, BUN, CREATININE, CALCIUM, MG, PHOS in the last 168 hours. Liver Function Tests: No results for input(s): AST, ALT, ALKPHOS, BILITOT, PROT, ALBUMIN in the last 168 hours. No results for input(s): LIPASE, AMYLASE in the last 168 hours. No results for input(s): AMMONIA in the last 168 hours. CBC: No results for input(s): WBC, NEUTROABS, HGB, HCT, MCV, PLT in the last 168  hours. Cardiac Enzymes: No results for input(s): CKTOTAL, CKMB, CKMBINDEX, TROPONINI in the last 168 hours. BNP: Invalid input(s): POCBNP CBG: No results for input(s): GLUCAP in the last 168 hours. D-Dimer No results for input(s): DDIMER in the last 72 hours. Hgb A1c No results for input(s): HGBA1C in the last 72 hours. Lipid Profile No results for input(s): CHOL, HDL, LDLCALC, TRIG, CHOLHDL, LDLDIRECT in the last 72 hours. Thyroid function studies No results for input(s): TSH, T4TOTAL, T3FREE, THYROIDAB in the last 72 hours.  Invalid input(s): FREET3 Anemia work up No results for input(s): VITAMINB12, FOLATE, FERRITIN, TIBC, IRON, RETICCTPCT in the last 72 hours. Urinalysis No results found for: COLORURINE, APPEARANCEUR, LABSPEC, [place], GLUCOSEU, HGBUR, BILIRUBINUR, KETONESUR, PROTEINUR, UROBILINOGEN, NITRITE, LEUKOCYTESUR Sepsis Labs Invalid input(s): PROCALCITONIN,  WBC,  LACTICIDVEN Microbiology No results found for this or any previous visit (from the past 240  hour(s)).  Please note: You were cared for by a hospitalist during your hospital stay. Once you are discharged, your primary care physician will handle any further medical issues. Please note that NO REFILLS for any discharge medications will be authorized once you are discharged, as it is imperative that you return to your primary care physician (or establish a relationship with a primary care physician if you do not have one) for your post hospital discharge needs so that they can reassess your need for medications and monitor your lab values.    Time coordinating discharge: 40 minutes  SIGNED:   Shelly Coss, MD  [facility] 03/01/2020, 12:59 PM Pager [phone]  If 7PM-7AM, please contact night-coverage [email or web address] Password [email or web address]

## 2020-03-04 DIAGNOSIS — Z299 Encounter for prophylactic measures, unspecified: Secondary | ICD-10-CM | POA: Diagnosis not present

## 2020-03-04 DIAGNOSIS — I4891 Unspecified atrial fibrillation: Secondary | ICD-10-CM | POA: Diagnosis not present

## 2020-03-04 DIAGNOSIS — I10 Essential (primary) hypertension: Secondary | ICD-10-CM | POA: Diagnosis not present

## 2020-03-04 DIAGNOSIS — Z09 Encounter for follow-up examination after completed treatment for conditions other than malignant neoplasm: Secondary | ICD-10-CM | POA: Diagnosis not present

## 2020-03-15 DIAGNOSIS — Z23 Encounter for immunization: Secondary | ICD-10-CM | POA: Diagnosis not present

## 2020-03-22 DIAGNOSIS — E669 Obesity, unspecified: Secondary | ICD-10-CM | POA: Diagnosis not present

## 2020-03-22 DIAGNOSIS — I4891 Unspecified atrial fibrillation: Secondary | ICD-10-CM | POA: Diagnosis not present

## 2020-03-22 DIAGNOSIS — Z6841 Body Mass Index (BMI) 40.0 and over, adult: Secondary | ICD-10-CM | POA: Diagnosis not present

## 2020-03-22 DIAGNOSIS — I5032 Chronic diastolic (congestive) heart failure: Secondary | ICD-10-CM | POA: Diagnosis not present

## 2020-03-22 DIAGNOSIS — I110 Hypertensive heart disease with heart failure: Secondary | ICD-10-CM | POA: Diagnosis not present

## 2020-04-08 DIAGNOSIS — I10 Essential (primary) hypertension: Secondary | ICD-10-CM | POA: Diagnosis not present

## 2020-04-08 DIAGNOSIS — I5032 Chronic diastolic (congestive) heart failure: Secondary | ICD-10-CM | POA: Diagnosis not present

## 2020-04-08 DIAGNOSIS — G4733 Obstructive sleep apnea (adult) (pediatric): Secondary | ICD-10-CM | POA: Diagnosis not present

## 2020-04-08 DIAGNOSIS — I471 Supraventricular tachycardia: Secondary | ICD-10-CM | POA: Diagnosis not present

## 2020-04-08 DIAGNOSIS — I480 Paroxysmal atrial fibrillation: Secondary | ICD-10-CM | POA: Diagnosis not present

## 2020-04-08 DIAGNOSIS — E782 Mixed hyperlipidemia: Secondary | ICD-10-CM | POA: Diagnosis not present

## 2020-06-03 DIAGNOSIS — I10 Essential (primary) hypertension: Secondary | ICD-10-CM | POA: Diagnosis not present

## 2020-06-03 DIAGNOSIS — I5032 Chronic diastolic (congestive) heart failure: Secondary | ICD-10-CM | POA: Diagnosis not present

## 2020-06-03 DIAGNOSIS — Z299 Encounter for prophylactic measures, unspecified: Secondary | ICD-10-CM | POA: Diagnosis not present

## 2020-06-03 DIAGNOSIS — D6869 Other thrombophilia: Secondary | ICD-10-CM | POA: Diagnosis not present

## 2020-06-03 DIAGNOSIS — I4891 Unspecified atrial fibrillation: Secondary | ICD-10-CM | POA: Diagnosis not present

## 2020-07-09 DIAGNOSIS — I471 Supraventricular tachycardia: Secondary | ICD-10-CM | POA: Diagnosis not present

## 2020-07-15 DIAGNOSIS — E782 Mixed hyperlipidemia: Secondary | ICD-10-CM | POA: Diagnosis not present

## 2020-07-15 DIAGNOSIS — R748 Abnormal levels of other serum enzymes: Secondary | ICD-10-CM | POA: Diagnosis not present

## 2020-07-23 DIAGNOSIS — Z299 Encounter for prophylactic measures, unspecified: Secondary | ICD-10-CM | POA: Diagnosis not present

## 2020-07-23 DIAGNOSIS — I5032 Chronic diastolic (congestive) heart failure: Secondary | ICD-10-CM | POA: Diagnosis not present

## 2020-07-23 DIAGNOSIS — Z79899 Other long term (current) drug therapy: Secondary | ICD-10-CM | POA: Diagnosis not present

## 2020-07-23 DIAGNOSIS — R109 Unspecified abdominal pain: Secondary | ICD-10-CM | POA: Diagnosis not present

## 2020-07-23 DIAGNOSIS — Z789 Other specified health status: Secondary | ICD-10-CM | POA: Diagnosis not present

## 2020-07-23 DIAGNOSIS — Z1339 Encounter for screening examination for other mental health and behavioral disorders: Secondary | ICD-10-CM | POA: Diagnosis not present

## 2020-07-23 DIAGNOSIS — I4891 Unspecified atrial fibrillation: Secondary | ICD-10-CM | POA: Diagnosis not present

## 2020-07-23 DIAGNOSIS — Z1331 Encounter for screening for depression: Secondary | ICD-10-CM | POA: Diagnosis not present

## 2020-07-23 DIAGNOSIS — M2550 Pain in unspecified joint: Secondary | ICD-10-CM | POA: Diagnosis not present

## 2020-07-23 DIAGNOSIS — I10 Essential (primary) hypertension: Secondary | ICD-10-CM | POA: Diagnosis not present

## 2020-07-23 DIAGNOSIS — Z0000 Encounter for general adult medical examination without abnormal findings: Secondary | ICD-10-CM | POA: Diagnosis not present

## 2020-07-23 DIAGNOSIS — Z6841 Body Mass Index (BMI) 40.0 and over, adult: Secondary | ICD-10-CM | POA: Diagnosis not present

## 2020-07-23 DIAGNOSIS — E559 Vitamin D deficiency, unspecified: Secondary | ICD-10-CM | POA: Diagnosis not present

## 2020-07-23 DIAGNOSIS — Z7189 Other specified counseling: Secondary | ICD-10-CM | POA: Diagnosis not present

## 2020-07-29 ENCOUNTER — Other Ambulatory Visit: Payer: Self-pay | Admitting: Internal Medicine

## 2020-07-29 DIAGNOSIS — Z1231 Encounter for screening mammogram for malignant neoplasm of breast: Secondary | ICD-10-CM

## 2020-08-16 DIAGNOSIS — R748 Abnormal levels of other serum enzymes: Secondary | ICD-10-CM | POA: Diagnosis not present

## 2020-09-12 DIAGNOSIS — I4891 Unspecified atrial fibrillation: Secondary | ICD-10-CM | POA: Diagnosis not present

## 2020-09-12 DIAGNOSIS — S93601A Unspecified sprain of right foot, initial encounter: Secondary | ICD-10-CM | POA: Diagnosis not present

## 2020-09-12 DIAGNOSIS — M79671 Pain in right foot: Secondary | ICD-10-CM | POA: Diagnosis not present

## 2020-09-12 DIAGNOSIS — M19071 Primary osteoarthritis, right ankle and foot: Secondary | ICD-10-CM | POA: Diagnosis not present

## 2020-09-12 DIAGNOSIS — I5032 Chronic diastolic (congestive) heart failure: Secondary | ICD-10-CM | POA: Diagnosis not present

## 2020-09-12 DIAGNOSIS — Z299 Encounter for prophylactic measures, unspecified: Secondary | ICD-10-CM | POA: Diagnosis not present

## 2020-09-12 DIAGNOSIS — I10 Essential (primary) hypertension: Secondary | ICD-10-CM | POA: Diagnosis not present

## 2020-09-12 DIAGNOSIS — M7731 Calcaneal spur, right foot: Secondary | ICD-10-CM | POA: Diagnosis not present

## 2020-09-16 DIAGNOSIS — R1111 Vomiting without nausea: Secondary | ICD-10-CM | POA: Diagnosis not present

## 2020-09-16 DIAGNOSIS — R141 Gas pain: Secondary | ICD-10-CM | POA: Diagnosis not present

## 2020-09-16 DIAGNOSIS — K219 Gastro-esophageal reflux disease without esophagitis: Secondary | ICD-10-CM | POA: Diagnosis not present

## 2020-09-16 DIAGNOSIS — R142 Eructation: Secondary | ICD-10-CM | POA: Diagnosis not present

## 2020-09-16 DIAGNOSIS — R143 Flatulence: Secondary | ICD-10-CM | POA: Diagnosis not present

## 2020-09-16 DIAGNOSIS — R1013 Epigastric pain: Secondary | ICD-10-CM | POA: Diagnosis not present

## 2020-09-16 DIAGNOSIS — R748 Abnormal levels of other serum enzymes: Secondary | ICD-10-CM | POA: Diagnosis not present

## 2020-09-16 DIAGNOSIS — R140 Abdominal distension (gaseous): Secondary | ICD-10-CM | POA: Diagnosis not present

## 2020-09-18 DIAGNOSIS — M25512 Pain in left shoulder: Secondary | ICD-10-CM | POA: Diagnosis not present

## 2020-09-18 DIAGNOSIS — M79671 Pain in right foot: Secondary | ICD-10-CM | POA: Diagnosis not present

## 2020-09-20 ENCOUNTER — Ambulatory Visit
Admission: RE | Admit: 2020-09-20 | Discharge: 2020-09-20 | Disposition: A | Discharge status: Home / Self Care | Payer: Medicare Other | Source: Ambulatory Visit | Attending: Internal Medicine | Admitting: Internal Medicine

## 2020-09-20 ENCOUNTER — Other Ambulatory Visit: Payer: Self-pay

## 2020-09-20 DIAGNOSIS — Z1231 Encounter for screening mammogram for malignant neoplasm of breast: Secondary | ICD-10-CM | POA: Diagnosis not present

## 2020-09-23 DIAGNOSIS — Z299 Encounter for prophylactic measures, unspecified: Secondary | ICD-10-CM | POA: Diagnosis not present

## 2020-09-23 DIAGNOSIS — I5032 Chronic diastolic (congestive) heart failure: Secondary | ICD-10-CM | POA: Diagnosis not present

## 2020-09-23 DIAGNOSIS — R748 Abnormal levels of other serum enzymes: Secondary | ICD-10-CM | POA: Diagnosis not present

## 2020-09-23 DIAGNOSIS — I4891 Unspecified atrial fibrillation: Secondary | ICD-10-CM | POA: Diagnosis not present

## 2020-09-23 DIAGNOSIS — I10 Essential (primary) hypertension: Secondary | ICD-10-CM | POA: Diagnosis not present

## 2020-09-27 DIAGNOSIS — I110 Hypertensive heart disease with heart failure: Secondary | ICD-10-CM | POA: Diagnosis not present

## 2020-09-27 DIAGNOSIS — I4891 Unspecified atrial fibrillation: Secondary | ICD-10-CM | POA: Diagnosis not present

## 2020-09-27 DIAGNOSIS — Z6841 Body Mass Index (BMI) 40.0 and over, adult: Secondary | ICD-10-CM | POA: Diagnosis not present

## 2020-09-27 DIAGNOSIS — I5030 Unspecified diastolic (congestive) heart failure: Secondary | ICD-10-CM | POA: Diagnosis not present

## 2020-09-27 DIAGNOSIS — E785 Hyperlipidemia, unspecified: Secondary | ICD-10-CM | POA: Diagnosis not present

## 2020-09-27 DIAGNOSIS — E669 Obesity, unspecified: Secondary | ICD-10-CM | POA: Diagnosis not present

## 2020-10-11 DIAGNOSIS — M79671 Pain in right foot: Secondary | ICD-10-CM | POA: Diagnosis not present

## 2020-10-23 DIAGNOSIS — E782 Mixed hyperlipidemia: Secondary | ICD-10-CM | POA: Diagnosis not present

## 2020-11-04 NOTE — Progress Notes (Signed)
Office Visit Note  Patient: Ana English             Date of Birth: 1950-11-03           MRN: [id number]             PCP: Tobi Bastos, PA-C Referring: Glenda Chroman, MD Visit Date: 11/18/2020 Occupation: _0 @  Subjective:  Elevated alkaline phosphatase.   History of Present Illness: Ana English is a 70 y.o. female seen in consultation per request of her PCP.  According to the patient she went for routine lab to see her cardiologist in February at that time her alkaline phosphatase was elevated.  She was referred to a gastroenterologist who did extensive work-up and LFTs were normal.  Her repeat alkaline phosphatase initially went up and then came down but is still elevated.  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  She states for the last 5 years she has been experiencing increased pain in her joints and muscles.  She states the pain is episodic and comes and goes.  She has a history of right knee joint injury in the past and continues to have right knee discomfort.  She has seen orthopedic surgeons at [facility] and has been diagnosed with osteoarthritis of the knee and bilateral feet.  Activities of Daily Living:  Patient reports morning stiffness for 20-30 minutes.   Patient Reports nocturnal pain.  Difficulty dressing/grooming: Denies Difficulty climbing stairs: Reports Difficulty getting out of chair: Denies Difficulty using hands for taps, buttons, cutlery, and/or writing: Denies  Review of Systems  Constitutional:  Positive for fatigue.  HENT:  Negative for mouth sores, mouth dryness and nose dryness.   Eyes:  Negative for pain, itching and dryness.  Respiratory:  Negative for shortness of breath and difficulty breathing.   Cardiovascular:  Positive for swelling in legs/feet. Negative for chest pain and palpitations.  Gastrointestinal:  Negative for  blood in stool, constipation and diarrhea.  Endocrine: Negative for increased urination.  Genitourinary:  Negative for difficulty urinating.  Musculoskeletal:  Positive for joint pain, joint pain, joint swelling, myalgias, muscle weakness, morning stiffness and myalgias. Negative for muscle tenderness.  Skin:  Negative for color change, rash and redness.  Allergic/Immunologic: Negative for susceptible to infections.  Neurological:  Positive for dizziness and headaches. Negative for numbness and memory loss.  Hematological:  Positive for bruising/bleeding tendency.  Psychiatric/Behavioral:  Negative for confusion.    PMFS History:  Patient Active Problem List   Diagnosis Date Noted   Chronic heart failure with preserved ejection fraction ([place]) 11/18/2020   Primary osteoarthritis of both feet 11/18/2020   Primary osteoarthritis of right knee 11/18/2020   History of hyperlipidemia 11/18/2020   History of asthma 11/18/2020   History of gastroesophageal reflux (GERD) 11/18/2020   Diverticular disease 11/18/2020   Atrial fibrillation with RVR ([place]) 02/18/2020   Chest pain 04/25/2018   Hypokalemia 04/25/2018   Sleep apnea 04/25/2018   GERD (gastroesophageal reflux disease) 04/25/2018   Paroxysmal atrial fibrillation ([place]) 04/25/2018   Hypertension 04/25/2018   S/P laparoscopic assisted vaginal hysterectomy (LAVH) 01/15/2015   Lipoma of back - left side - 8 cm 09/23/2011   Hyperlipidemia 06/30/2010   Asthma 06/30/2010   DEGENERATIVE JOINT DISEASE 06/30/2010   CHEST PAIN 06/30/2010    Past Medical History:  Diagnosis Date   A-fib ([place])    Anemia  Asthma    Chest discomfort    Complication of anesthesia    dysrhythmmia postop- pre atrial fib dx.   DJD (degenerative joint disease)    Dysrhythmia    atrial fib.   GERD (gastroesophageal reflux disease)    Hyperlipidemia    Hypertension    Mass on back    PSVT (paroxysmal supraventricular tachycardia) (HCC)    Sleep apnea     rare use of CPAP    Family History  Problem Relation Age of Onset   Diabetes Mother    Dementia Mother    Arthritis Father    Hypertension Daughter    Breast cancer Daughter 2   Anemia Daughter    Hypertension Daughter    Hypertension Daughter    Hypertension Son    Past Surgical History:  Procedure Laterality Date   ABDOMINAL HYSTERECTOMY     CESAREAN SECTION  over 30 years ago   x3   CHOLECYSTECTOMY  05/25/1988   COLONOSCOPY     Remote   Social History   Social History Narrative   Married with 4 children   No regular exercise   Immunization History  Administered Date(s) Administered   Influenza, High Dose Seasonal PF 04/26/2018   Moderna Sars-Covid-2 Vaccination 05/29/2019, 06/26/2019, 03/15/2020     Objective: Vital Signs: BP 129/84 (BP Location: Right Arm, Patient Position: Sitting, Cuff Size: Large)   Pulse 70   Resp 17   Ht 5' 5.5" (1.664 m)   Wt 256 lb 9.6 oz (116.4 kg)   BMI 42.05 kg/m    Physical Exam Vitals and nursing note reviewed.  Constitutional:      Appearance: She is well-developed.  HENT:     Head: Normocephalic and atraumatic.  Eyes:     Conjunctiva/sclera: Conjunctivae normal.  Cardiovascular:     Rate and Rhythm: Normal rate and regular rhythm.     Heart sounds: Normal heart sounds.  Pulmonary:     Effort: Pulmonary effort is normal.     Breath sounds: Normal breath sounds.  Abdominal:     General: Bowel sounds are normal.     Palpations: Abdomen is soft.  Musculoskeletal:     Cervical back: Normal range of motion.  Skin:    General: Skin is warm and dry.     Capillary Refill: Capillary refill takes less than 2 seconds.  Neurological:     Mental Status: She is alert and oriented to person, place, and time.  Psychiatric:        Behavior: Behavior normal.     Musculoskeletal Exam: C-spine was in good range of motion.  Shoulder joints, elbow joints were in good range of motion.  She had no tenderness over MCPs, PIPs, DIPs or  wrist joints.  Osteoarthritis was noted in the right first PIP joint.  Hip joints with good range of motion.  She had warmth on palpation of her right knee joint with limited extension.  Left knee joint was in full range of motion.  There was no tenderness over ankles or MTPs.  CDAI Exam: CDAI Score: -- Patient Global: --; Provider Global: -- Swollen: --; Tender: -- Joint Exam 11/18/2020   No joint exam has been documented for this visit   There is currently no information documented on the homunculus. Go to the Rheumatology activity and complete the homunculus joint exam.  Investigation: No additional findings.  Imaging: XR KNEE 3 VIEW RIGHT  Result Date: 11/18/2020 Severe medial compartment narrowing was noted with medial and intercondylar  osteophytes.  Severe patellofemoral narrowing was noted.  No chondrocalcinosis was noted. Impression: These findings are consistent with severe osteoarthritis and severe chondromalacia patella.   Recent Labs: Lab Results  Component Value Date   WBC 5.9 02/18/2020   HGB 13.1 02/18/2020   PLT 249 02/18/2020   NA 136 02/18/2020   K 3.8 02/18/2020   CL 102 02/18/2020   CO2 23 02/18/2020   GLUCOSE 101 (H) 02/18/2020   BUN 21 02/18/2020   CREATININE 1.06 (H) 02/18/2020   BILITOT 0.6 02/18/2020   ALKPHOS 90 02/18/2020   AST 16 02/18/2020   ALT 10 02/18/2020   PROT 6.6 02/18/2020   ALBUMIN 3.6 02/18/2020   CALCIUM 8.8 (L) 02/18/2020   GFRAA >60 02/18/2020  Labs from [facility]: July 09, 2020 CBC WBC 5.3, hemoglobin 13.6, platelets 241 July 09, 2020 alkaline phosphatase 345 July 15, 2020 alkaline phosphatase 288 July 19, 2020 alkaline phosphatase 201, LFTs normal  Speciality Comments: No specialty comments available.  Procedures:  No procedures performed Allergies: Patient has no known allergies.   Assessment / Plan:     Visit Diagnoses: Blood alkaline phosphatase increased compared with prior measurement - Alk phos 359 on  07/23/20 -patient has persistent elevation of alkaline phosphatase although numbers have come down.  I will obtain following labs and also schedule a total body bone scan.  She complains of generalized arthralgias and myalgias.  Although her symptoms are intermittent.  Plan: Sedimentation rate, Serum protein electrophoresis with reflex, IgG, IgA, IgM  Chronic pain of right knee -she has difficulty walking due to right knee joint pain.  She limps when she walks.  She had warmth on palpation of her right knee joint and limited extension.  Plan: Rheumatoid factor, Cyclic citrul peptide antibody, IgG, ANA, XR KNEE 3 VIEW RIGHT.  X-ray of the knee joint showed severe osteoarthritis and severe chondromalacia patella.  She will require total knee replacement.  Primary osteoarthritis of right knee-previous x-rays showed osteoarthritis.  Primary osteoarthritis of both feet-patient is followed at [facility] and has been told that she has osteoarthritis in her both feet.  Other fatigue -she gives history of episodic increased fatigue and myalgias.  I will obtain following labs today.  Plan: CK, TSH, COMPLETE METABOLIC PANEL WITH GFR  Myalgia-she gives history of intermittent myalgias.  Other medical problems are listed as follows:  Primary hypertension-her blood pressure is well controlled on current regimen.  Chronic heart failure with preserved ejection fraction (HCC)-followed by [facility] cardiology.  History of hyperlipidemia  Paroxysmal atrial fibrillation (HCC)  History of asthma  History of gastroesophageal reflux (GERD)  Diverticular disease  Other sleep apnea  S/P laparoscopic assisted vaginal hysterectomy (LAVH)  Family history of rheumatoid arthritis - Father  Family history of systemic lupus erythematosus - [place]  Orders: Orders Placed This Encounter  Procedures   XR KNEE 3 VIEW RIGHT   NM Bone Scan Whole Body   Sedimentation rate   CK   TSH   Rheumatoid factor   Cyclic citrul  peptide antibody, IgG   Serum protein electrophoresis with reflex   IgG, IgA, IgM   ANA   COMPLETE METABOLIC PANEL WITH GFR    No orders of the defined types were placed in this encounter.    Follow-Up Instructions: Return for Elevated alkaline phosphatase.   Bo Merino, MD  Note - This record has been created using Editor, commissioning.  Chart creation errors have been sought, but may not always  have been located. Such creation errors  do not reflect on  the standard of medical care.

## 2020-11-18 ENCOUNTER — Other Ambulatory Visit: Payer: Self-pay

## 2020-11-18 ENCOUNTER — Encounter: Payer: Self-pay | Admitting: Rheumatology

## 2020-11-18 ENCOUNTER — Ambulatory Visit: Payer: Self-pay

## 2020-11-18 ENCOUNTER — Ambulatory Visit (INDEPENDENT_AMBULATORY_CARE_PROVIDER_SITE_OTHER): Payer: Medicare Other | Admitting: Rheumatology

## 2020-11-18 VITALS — BP 129/84 | HR 70 | Resp 17 | Ht 65.5 in | Wt 256.6 lb

## 2020-11-18 DIAGNOSIS — Z8719 Personal history of other diseases of the digestive system: Secondary | ICD-10-CM | POA: Diagnosis not present

## 2020-11-18 DIAGNOSIS — Z8639 Personal history of other endocrine, nutritional and metabolic disease: Secondary | ICD-10-CM | POA: Diagnosis not present

## 2020-11-18 DIAGNOSIS — M1711 Unilateral primary osteoarthritis, right knee: Secondary | ICD-10-CM | POA: Diagnosis not present

## 2020-11-18 DIAGNOSIS — R748 Abnormal levels of other serum enzymes: Secondary | ICD-10-CM | POA: Diagnosis not present

## 2020-11-18 DIAGNOSIS — Z90710 Acquired absence of both cervix and uterus: Secondary | ICD-10-CM

## 2020-11-18 DIAGNOSIS — I5032 Chronic diastolic (congestive) heart failure: Secondary | ICD-10-CM | POA: Diagnosis not present

## 2020-11-18 DIAGNOSIS — M25561 Pain in right knee: Secondary | ICD-10-CM

## 2020-11-18 DIAGNOSIS — I480 Paroxysmal atrial fibrillation: Secondary | ICD-10-CM | POA: Diagnosis not present

## 2020-11-18 DIAGNOSIS — Z8709 Personal history of other diseases of the respiratory system: Secondary | ICD-10-CM | POA: Diagnosis not present

## 2020-11-18 DIAGNOSIS — Z8269 Family history of other diseases of the musculoskeletal system and connective tissue: Secondary | ICD-10-CM

## 2020-11-18 DIAGNOSIS — G8929 Other chronic pain: Secondary | ICD-10-CM | POA: Diagnosis not present

## 2020-11-18 DIAGNOSIS — R5383 Other fatigue: Secondary | ICD-10-CM | POA: Diagnosis not present

## 2020-11-18 DIAGNOSIS — I10 Essential (primary) hypertension: Secondary | ICD-10-CM

## 2020-11-18 DIAGNOSIS — M791 Myalgia, unspecified site: Secondary | ICD-10-CM

## 2020-11-18 DIAGNOSIS — M19071 Primary osteoarthritis, right ankle and foot: Secondary | ICD-10-CM

## 2020-11-18 DIAGNOSIS — Z8261 Family history of arthritis: Secondary | ICD-10-CM

## 2020-11-18 DIAGNOSIS — K5790 Diverticulosis of intestine, part unspecified, without perforation or abscess without bleeding: Secondary | ICD-10-CM

## 2020-11-18 DIAGNOSIS — G4739 Other sleep apnea: Secondary | ICD-10-CM

## 2020-11-18 DIAGNOSIS — M19072 Primary osteoarthritis, left ankle and foot: Secondary | ICD-10-CM

## 2020-11-22 LAB — COMPLETE METABOLIC PANEL WITH GFR
AG Ratio: 1.6 (calc) (ref 1.0–2.5)
ALT: 13 U/L (ref 6–29)
AST: 18 U/L (ref 10–35)
Albumin: 4 g/dL (ref 3.6–5.1)
Alkaline phosphatase (APISO): 104 U/L (ref 37–153)
BUN/Creatinine Ratio: 26 (calc) — ABNORMAL HIGH (ref 6–22)
BUN: 28 mg/dL — ABNORMAL HIGH (ref 7–25)
CO2: 32 mmol/L (ref 20–32)
Calcium: 9.7 mg/dL (ref 8.6–10.4)
Chloride: 102 mmol/L (ref 98–110)
Creat: 1.09 mg/dL — ABNORMAL HIGH (ref 0.60–0.93)
GFR, Est African American: 60 mL/min/{1.73_m2} (ref >=60)
GFR, Est Non African American: 51 mL/min/{1.73_m2} — ABNORMAL LOW (ref >=60)
Globulin: 2.5 g/dL (calc) (ref 1.9–3.7)
Glucose, Bld: 78 mg/dL (ref 65–99)
Potassium: 4.1 mmol/L (ref 3.5–5.3)
Sodium: 141 mmol/L (ref 135–146)
Total Bilirubin: 0.5 mg/dL (ref 0.2–1.2)
Total Protein: 6.5 g/dL (ref 6.1–8.1)

## 2020-11-22 LAB — PROTEIN ELECTROPHORESIS, SERUM, WITH REFLEX
Albumin ELP: 4 g/dL (ref 3.8–4.8)
Alpha 1: 0.3 g/dL (ref 0.2–0.3)
Alpha 2: 0.8 g/dL (ref 0.5–0.9)
Beta 2: 0.3 g/dL (ref 0.2–0.5)
Beta Globulin: 0.4 g/dL (ref 0.4–0.6)
Gamma Globulin: 0.9 g/dL (ref 0.8–1.7)
Total Protein: 6.7 g/dL (ref 6.1–8.1)

## 2020-11-22 LAB — ANA: Anti Nuclear Antibody (ANA): NEGATIVE

## 2020-11-22 LAB — CK: Total CK: 85 U/L (ref 29–143)

## 2020-11-22 LAB — IGG, IGA, IGM
IgG (Immunoglobin G), Serum: 1032 mg/dL (ref 600–1540)
IgM, Serum: 58 mg/dL (ref 50–300)
Immunoglobulin A: 118 mg/dL (ref 70–320)

## 2020-11-22 LAB — CYCLIC CITRUL PEPTIDE ANTIBODY, IGG: Cyclic Citrullin Peptide Ab: <16 UNITS

## 2020-11-22 LAB — SEDIMENTATION RATE: Sed Rate: 11 mm/h (ref 0–30)

## 2020-11-22 LAB — RHEUMATOID FACTOR: Rheumatoid fact SerPl-aCnc: <14 IU/mL (ref <14)

## 2020-11-22 LAB — TSH: TSH: 0.72 mIU/L (ref 0.40–4.50)

## 2020-11-22 NOTE — Progress Notes (Signed)
Sed rate is normal, CK is normal, TSH is normal rheumatoid factor and anti-CCP are negative.  SPEP normal, immunoglobulins normal, ANA negative, GFR is low and stable.  I will discuss results at the follow-up visit.

## 2020-11-24 DIAGNOSIS — Z20822 Contact with and (suspected) exposure to covid-19: Secondary | ICD-10-CM | POA: Diagnosis not present

## 2020-11-27 ENCOUNTER — Other Ambulatory Visit: Payer: Self-pay

## 2020-11-27 ENCOUNTER — Ambulatory Visit (INDEPENDENT_AMBULATORY_CARE_PROVIDER_SITE_OTHER): Payer: Medicare Other | Admitting: Podiatrist

## 2020-11-27 DIAGNOSIS — L84 Corns and callosities: Secondary | ICD-10-CM | POA: Diagnosis not present

## 2020-11-27 NOTE — Patient Instructions (Signed)
Corns and Calluses Corns are small areas of thickened skin that form on the top, sides, or tip of a toe. Corns have a cone-shaped core with a point that can press on a nerve below. This causes pain. Calluses are areas of thickened skin that can form anywhere on the body, including the hands, fingers, palms, soles of the feet, and heels. Calluses are usually larger than corns. What are the causes? Corns and calluses are caused by rubbing (friction) or pressure, such as from shoes that are too tight or do not fit properly. What increases the risk? Corns are more likely to develop in people who have misshapen toes (toe deformities), such as hammer toes. Calluses can form with friction to any area of the skin. They are more likely to develop in people who: Work with their hands. Wear shoes that fit poorly, are too tight, or are high-heeled. Have toe deformities. What are the signs or symptoms? Symptoms of a corn or callus include: A hard growth on the skin. Pain or tenderness under the skin. Redness and swelling. Increased discomfort while wearing tight-fitting shoes, if your feet are affected. If a corn or callus becomes infected, symptoms may include: Redness and swelling that gets worse. Pain. Fluid, blood, or pus draining from the corn or callus. How is this diagnosed? Corns and calluses may be diagnosed based on your symptoms, your medical history, and a physical exam. How is this treated? Treatment for corns and calluses may include: Removing the cause of the friction or pressure. This may involve: Changing your shoes. Wearing shoe inserts (orthotics) or other protective layers in your shoes, such as a corn pad. Wearing gloves. Applying medicine to the skin (topical medicine) to help soften skin in the hardened, thickened areas. Removing layers of dead skin with a file to reduce the size of the corn or callus. Removing the corn or callus with a scalpel or laser. Taking antibiotic  medicines, if your corn or callus is infected. Having surgery, if a toe deformity is the cause. Follow these instructions at home:  Take over-the-counter and prescription medicines only as told by your health care provider. If you were prescribed an antibiotic medicine, take it as told by your health care provider. Do not stop taking it even if your condition improves. Wear shoes that fit well. Avoid wearing high-heeled shoes and shoes that are too tight or too loose. Wear any padding, protective layers, gloves, or orthotics as told by your health care provider. Soak your hands or feet. Then use a file or pumice stone to soften your corn or callus. Do this as told by your health care provider. Check your corn or callus every day for signs of infection. Contact a health care provider if: Your symptoms do not improve with treatment. You have redness or swelling that gets worse. Your corn or callus becomes painful. You have fluid, blood, or pus coming from your corn or callus. You have new symptoms. Get help right away if: You develop severe pain with redness. Summary Corns are small areas of thickened skin that form on the top, sides, or tip of a toe. These can be painful. Calluses are areas of thickened skin that can form anywhere on the body, including the hands, fingers, palms, and soles of the feet. Calluses are usually larger than corns. Corns and calluses are caused by rubbing (friction) or pressure, such as from shoes that are too tight or do not fit properly. Treatment may include wearing padding, protective   layers, gloves, or orthotics as told by your health care provider. This information is not intended to replace advice given to you by your health care provider. Make sure you discuss any questions you have with your health care provider. Document Revised: 09/07/2019 Document Reviewed: 09/07/2019 Elsevier Patient Education  2022 [employer]  

## 2020-12-04 ENCOUNTER — Encounter: Payer: Self-pay | Admitting: Podiatrist

## 2020-12-04 NOTE — Progress Notes (Signed)
Chief Complaint  Patient presents with   Callouses    Calluse at the bottom of pt right foot x 2 months.      HPI: Patient is 70 y.o. female who presents today for the concerns as listed above. She relates a painful callus that hurts when standing or walking.    Patient Active Problem List   Diagnosis Date Noted   Chronic heart failure with preserved ejection fraction ([employer]) 11/18/2020   Primary osteoarthritis of both feet 11/18/2020   Primary osteoarthritis of right knee 11/18/2020   History of hyperlipidemia 11/18/2020   History of asthma 11/18/2020   History of gastroesophageal reflux (GERD) 11/18/2020   Diverticular disease 11/18/2020   Atrial fibrillation with RVR ([place]) 02/18/2020   Chest pain 04/25/2018   Hypokalemia 04/25/2018   Sleep apnea 04/25/2018   GERD (gastroesophageal reflux disease) 04/25/2018   Paroxysmal atrial fibrillation ([place]) 04/25/2018   Hypertension 04/25/2018   S/P laparoscopic assisted vaginal hysterectomy (LAVH) 01/15/2015   Lipoma of back - left side - 8 cm 09/23/2011   Hyperlipidemia 06/30/2010   Asthma 06/30/2010   DEGENERATIVE JOINT DISEASE 06/30/2010   CHEST PAIN 06/30/2010    Current Outpatient Medications on File Prior to Visit  Medication Sig Dispense Refill   acetaminophen (TYLENOL) 500 MG tablet Take 500 mg by mouth every 6 (six) hours as needed for mild pain or moderate pain.     atorvastatin (LIPITOR) 80 MG tablet Take 80 mg by mouth daily.     calcium carbonate (TUMS - DOSED IN MG ELEMENTAL CALCIUM) 500 MG chewable tablet Chew 1 tablet by mouth daily as needed for indigestion or heartburn.      CALCIUM-VITAMIN D PO Take 1 tablet by mouth daily. Calcium is 1000mg      etodolac (LODINE) 400 MG tablet Take 400 mg by mouth daily as needed (Arthritis).      irbesartan (AVAPRO) 150 MG tablet Take 75 mg by mouth every morning.  (Patient not taking: No sig reported)  3   levalbuterol (XOPENEX HFA) 45 MCG/ACT inhaler Inhale 2 puffs into the lungs  every 4 (four) hours as needed for wheezing.     nitroGLYCERIN (NITROSTAT) 0.4 MG SL tablet Place 1 tablet (0.4 mg total) under the tongue every 5 (five) minutes as needed for chest pain. 25 tablet 12   omeprazole (PRILOSEC) 20 MG capsule Take 20 mg by mouth as needed.     potassium chloride (KLOR-CON) 10 MEQ tablet Take 10 mEq by mouth daily.     rivaroxaban (XARELTO) 20 MG TABS tablet Take 20 mg by mouth every morning.      spironolactone (ALDACTONE) 25 MG tablet Take 25 mg by mouth daily.     torsemide (DEMADEX) 20 MG tablet Take 20 mg by mouth in the morning and at bedtime.     verapamil (VERELAN PM) 360 MG 24 hr capsule Take 300 mg by mouth every morning.      vitamin B-12 (CYANOCOBALAMIN) 1000 MCG tablet Take 1,000 mcg by mouth daily.       No current facility-administered medications on file prior to visit.    No Known Allergies  Review of Systems No fevers, chills, nausea, muscle aches, no difficulty breathing, no calf pain, no chest pain or shortness of breath.   Physical Exam  GENERAL APPEARANCE: Alert, conversant. Appropriately groomed. No acute distress.   VASCULAR: Pedal pulses palpable DP and PT bilateral.  Capillary refill time is immediate to all digits,  Proximal to distal cooling it warm  to warm.  Digital perfusion adequate.   NEUROLOGIC: sensation is intact to 5.07 monofilament at 5/5 sites bilateral.  Light touch is intact bilateral, vibratory sensation intact bilateral  MUSCULOSKELETAL: acceptable muscle strength, tone and stability bilateral.  No gross boney pedal deformities noted.  No pain, crepitus or limitation noted with foot and ankle range of motion bilateral.   DERMATOLOGIC: skin is warm, supple, and dry.  No open lesions noted.  No rash,  Hyperkeratotic tissue/ callus present submetatarsal 4/5 of the right foot.  Pain with direct pressure is noted.  Skin tension lines present consistent with pressure callus.    Assessment   1. Callus of foot       Plan  Discussed exam and treatment options with the patient.  I pared the callus with a 15 blade today without complication.  I recommended soft, supportive shoegear for her to slow recurrence.  She will call if the callus returns or if any questions arise.

## 2020-12-05 ENCOUNTER — Other Ambulatory Visit: Payer: Self-pay

## 2020-12-05 ENCOUNTER — Encounter (HOSPITAL_COMMUNITY)
Admission: RE | Admit: 2020-12-05 | Discharge: 2020-12-05 | Disposition: A | Discharge status: Home / Self Care | Payer: Medicare Other | Source: Ambulatory Visit | Attending: Rheumatology | Admitting: Rheumatology

## 2020-12-05 DIAGNOSIS — M25571 Pain in right ankle and joints of right foot: Secondary | ICD-10-CM | POA: Diagnosis not present

## 2020-12-05 DIAGNOSIS — M25522 Pain in left elbow: Secondary | ICD-10-CM | POA: Diagnosis not present

## 2020-12-05 DIAGNOSIS — R748 Abnormal levels of other serum enzymes: Secondary | ICD-10-CM | POA: Diagnosis not present

## 2020-12-05 DIAGNOSIS — M25551 Pain in right hip: Secondary | ICD-10-CM | POA: Diagnosis not present

## 2020-12-07 NOTE — Progress Notes (Deleted)
Office Visit Note  Patient: Ana English             Date of Birth: 1950/09/08           MRN: [id number]             PCP: Glenda Chroman, MD Referring: Tobi Bastos, PA-C Visit Date: 12/17/2020 Occupation: @GUAROCC @  Subjective:  No chief complaint on file.   History of Present Illness: Ana English is a 70 y.o. female ***   Activities of Daily Living:  Patient reports morning stiffness for *** {minute/hour:19697}.   Patient {ACTIONS;DENIES/REPORTS:21021675::"Denies"} nocturnal pain.  Difficulty dressing/grooming: {ACTIONS;DENIES/REPORTS:21021675::"Denies"} Difficulty climbing stairs: {ACTIONS;DENIES/REPORTS:21021675::"Denies"} Difficulty getting out of chair: {ACTIONS;DENIES/REPORTS:21021675::"Denies"} Difficulty using hands for taps, buttons, cutlery, and/or writing: {ACTIONS;DENIES/REPORTS:21021675::"Denies"}  No Rheumatology ROS completed.   PMFS History:  Patient Active Problem List   Diagnosis Date Noted   Chronic heart failure with preserved ejection fraction ([place]) 11/18/2020   Primary osteoarthritis of both feet 11/18/2020   Primary osteoarthritis of right knee 11/18/2020   History of hyperlipidemia 11/18/2020   History of asthma 11/18/2020   History of gastroesophageal reflux (GERD) 11/18/2020   Diverticular disease 11/18/2020   Atrial fibrillation with RVR ([place]) 02/18/2020   Chest pain 04/25/2018   Hypokalemia 04/25/2018   Sleep apnea 04/25/2018   GERD (gastroesophageal reflux disease) 04/25/2018   Paroxysmal atrial fibrillation ([place]) 04/25/2018   Hypertension 04/25/2018   S/P laparoscopic assisted vaginal hysterectomy (LAVH) 01/15/2015   Lipoma of back - left side - 8 cm 09/23/2011   Hyperlipidemia 06/30/2010   Asthma 06/30/2010   DEGENERATIVE JOINT DISEASE 06/30/2010   CHEST PAIN 06/30/2010    Past Medical History:  Diagnosis Date   A-fib ([place])    Anemia    Asthma    Chest discomfort    Complication of anesthesia    dysrhythmmia postop-  pre atrial fib dx.   DJD (degenerative joint disease)    Dysrhythmia    atrial fib.   GERD (gastroesophageal reflux disease)    Hyperlipidemia    Hypertension    Mass on back    PSVT (paroxysmal supraventricular tachycardia) ([facility])    Sleep apnea    rare use of CPAP    Family History  Problem Relation Age of Onset   Diabetes Mother    Dementia Mother    Arthritis Father    Hypertension Daughter    Breast cancer Daughter 37   Anemia Daughter    Hypertension Daughter    Hypertension Daughter    Hypertension Son    Past Surgical History:  Procedure Laterality Date   ABDOMINAL HYSTERECTOMY     CESAREAN SECTION  over 30 years ago   x3   CHOLECYSTECTOMY  05/25/1988   COLONOSCOPY     Remote   Social History   Social History Narrative   Married with 4 children   No regular exercise   Immunization History  Administered Date(s) Administered   Influenza, High Dose Seasonal PF 04/26/2018   Moderna Sars-Covid-2 Vaccination 05/29/2019, 06/26/2019, 03/15/2020     Objective: Vital Signs: There were no vitals taken for this visit.   Physical Exam   Musculoskeletal Exam: ***  CDAI Exam: CDAI Score: -- Patient Global: --; Provider Global: -- Swollen: --; Tender: -- Joint Exam 12/17/2020   No joint exam has been documented for this visit   There is currently no information documented on the homunculus. Go to the Rheumatology activity and complete the homunculus joint exam.  Investigation: No additional  findings.  Imaging: XR KNEE 3 VIEW RIGHT  Result Date: 11/18/2020 Severe medial compartment narrowing was noted with medial and intercondylar osteophytes.  Severe patellofemoral narrowing was noted.  No chondrocalcinosis was noted. Impression: These findings are consistent with severe osteoarthritis and severe chondromalacia patella.   Recent Labs: Lab Results  Component Value Date   WBC 5.9 02/18/2020   HGB 13.1 02/18/2020   PLT 249 02/18/2020   NA 141 11/18/2020    K 4.1 11/18/2020   CL 102 11/18/2020   CO2 32 11/18/2020   GLUCOSE 78 11/18/2020   BUN 28 (H) 11/18/2020   CREATININE 1.09 (H) 11/18/2020   BILITOT 0.5 11/18/2020   ALKPHOS 90 02/18/2020   AST 18 11/18/2020   ALT 13 11/18/2020   PROT 6.7 11/18/2020   PROT 6.5 11/18/2020   ALBUMIN 3.6 02/18/2020   CALCIUM 9.7 11/18/2020   GFRAA 60 11/18/2020   November 18, 2020 SPEP normal, immunoglobulins normal, CK 85, TSH normal, sed rate 11, RF negative, anti-CCP negative, ANA negative   Speciality Comments: No specialty comments available.  Procedures:  No procedures performed Allergies: Patient has no known allergies.   Assessment / Plan:     Visit Diagnoses: No diagnosis found.  Orders: No orders of the defined types were placed in this encounter.  No orders of the defined types were placed in this encounter.   Face-to-face time spent with patient was *** minutes. Greater than 50% of time was spent in counseling and coordination of care.  Follow-Up Instructions: No follow-ups on file.   Bo Merino, MD  Note - This record has been created using Editor, commissioning.  Chart creation errors have been sought, but may not always  have been located. Such creation errors do not reflect on  the standard of medical care.

## 2020-12-09 DIAGNOSIS — M170 Bilateral primary osteoarthritis of knee: Secondary | ICD-10-CM | POA: Diagnosis not present

## 2020-12-09 NOTE — Progress Notes (Signed)
Total body bone scan is consistent with osteoarthritis.  No other abnormalities were noted.

## 2020-12-12 DIAGNOSIS — Z01419 Encounter for gynecological examination (general) (routine) without abnormal findings: Secondary | ICD-10-CM | POA: Diagnosis not present

## 2020-12-12 DIAGNOSIS — Z6841 Body Mass Index (BMI) 40.0 and over, adult: Secondary | ICD-10-CM | POA: Diagnosis not present

## 2020-12-13 ENCOUNTER — Encounter: Payer: Self-pay | Admitting: Rheumatology

## 2020-12-13 NOTE — Telephone Encounter (Signed)
Ok to keep appt if she would like to discuss proceeding with visco injections for her right knee OA.  Or she can follow up as needed.

## 2020-12-17 ENCOUNTER — Ambulatory Visit: Payer: Medicare Other | Admitting: Rheumatology

## 2020-12-17 DIAGNOSIS — M19072 Primary osteoarthritis, left ankle and foot: Secondary | ICD-10-CM

## 2020-12-17 DIAGNOSIS — Z90710 Acquired absence of both cervix and uterus: Secondary | ICD-10-CM

## 2020-12-17 DIAGNOSIS — G4739 Other sleep apnea: Secondary | ICD-10-CM

## 2020-12-17 DIAGNOSIS — M1711 Unilateral primary osteoarthritis, right knee: Secondary | ICD-10-CM

## 2020-12-17 DIAGNOSIS — Z8719 Personal history of other diseases of the digestive system: Secondary | ICD-10-CM

## 2020-12-17 DIAGNOSIS — R5383 Other fatigue: Secondary | ICD-10-CM

## 2020-12-17 DIAGNOSIS — I10 Essential (primary) hypertension: Secondary | ICD-10-CM

## 2020-12-17 DIAGNOSIS — K5790 Diverticulosis of intestine, part unspecified, without perforation or abscess without bleeding: Secondary | ICD-10-CM

## 2020-12-17 DIAGNOSIS — M791 Myalgia, unspecified site: Secondary | ICD-10-CM

## 2020-12-17 DIAGNOSIS — Z8639 Personal history of other endocrine, nutritional and metabolic disease: Secondary | ICD-10-CM

## 2020-12-17 DIAGNOSIS — Z8709 Personal history of other diseases of the respiratory system: Secondary | ICD-10-CM

## 2020-12-17 DIAGNOSIS — R748 Abnormal levels of other serum enzymes: Secondary | ICD-10-CM

## 2020-12-17 DIAGNOSIS — I480 Paroxysmal atrial fibrillation: Secondary | ICD-10-CM

## 2020-12-17 DIAGNOSIS — I5032 Chronic diastolic (congestive) heart failure: Secondary | ICD-10-CM

## 2020-12-17 DIAGNOSIS — Z8261 Family history of arthritis: Secondary | ICD-10-CM

## 2020-12-17 DIAGNOSIS — Z8269 Family history of other diseases of the musculoskeletal system and connective tissue: Secondary | ICD-10-CM

## 2021-01-16 DIAGNOSIS — M1711 Unilateral primary osteoarthritis, right knee: Secondary | ICD-10-CM | POA: Diagnosis not present

## 2021-01-24 DIAGNOSIS — M1711 Unilateral primary osteoarthritis, right knee: Secondary | ICD-10-CM | POA: Diagnosis not present

## 2021-02-05 DIAGNOSIS — M1711 Unilateral primary osteoarthritis, right knee: Secondary | ICD-10-CM | POA: Diagnosis not present

## 2021-03-13 DIAGNOSIS — Z23 Encounter for immunization: Secondary | ICD-10-CM | POA: Diagnosis not present

## 2021-03-18 DIAGNOSIS — M25561 Pain in right knee: Secondary | ICD-10-CM | POA: Diagnosis not present

## 2021-03-22 IMAGING — DX DG CHEST 2V
2 series · 2 of 2 positions shown · non-contrast
Comparison: Radiograph 04/25/2018

CLINICAL DATA: Chest pain.

EXAM:
CHEST - 2 VIEW

[chest pa]
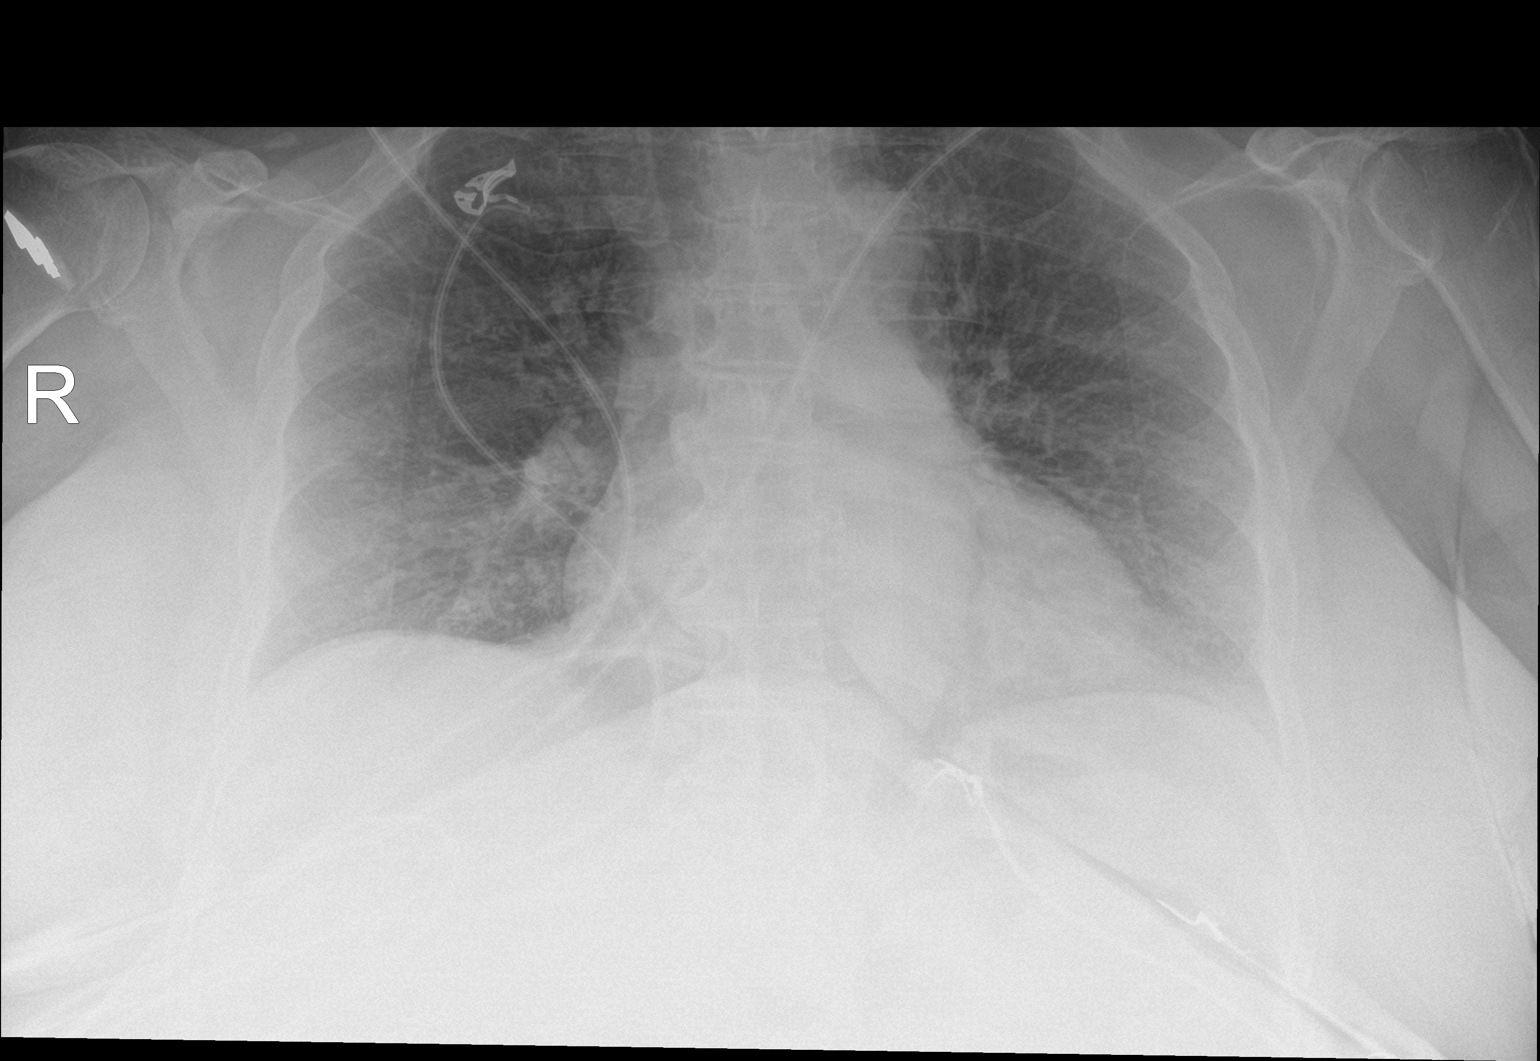

[chest lat]
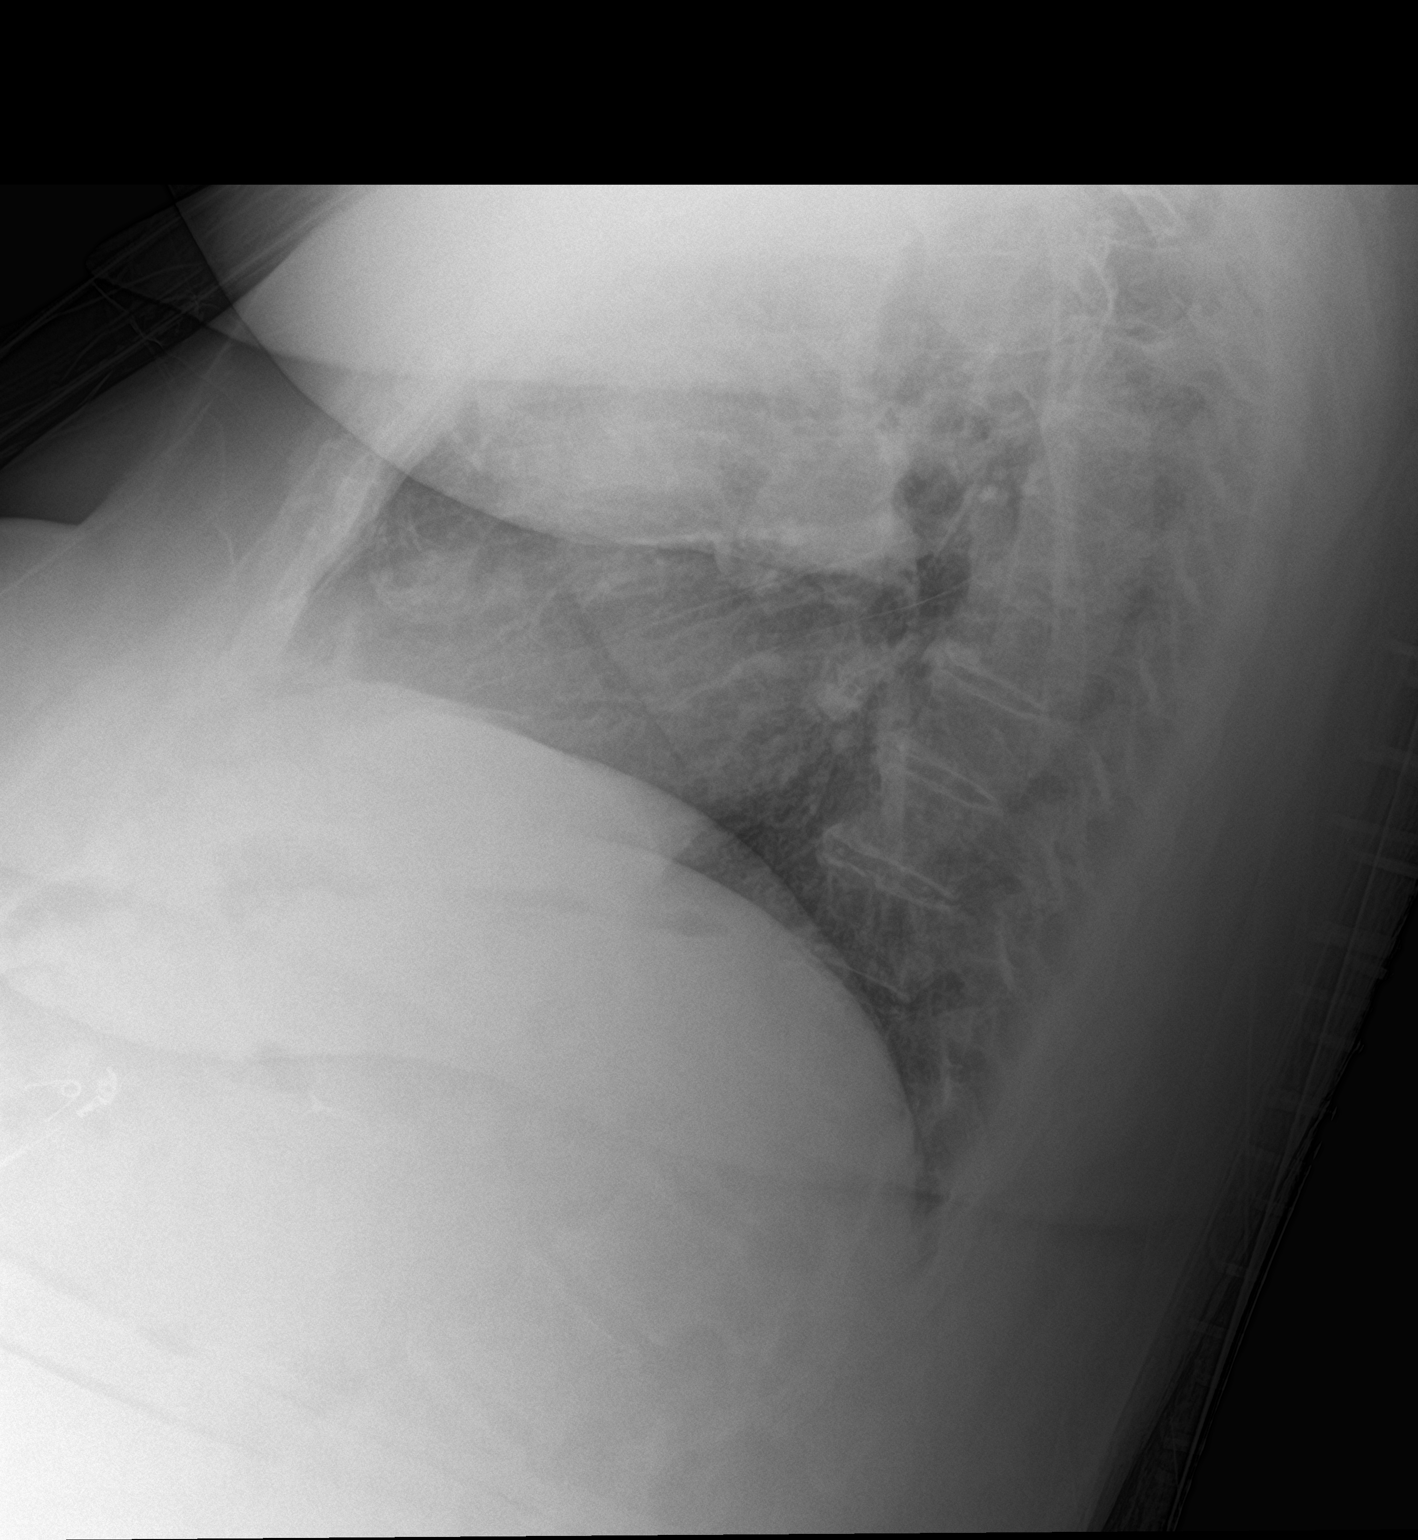

[2 of 2 positions shown; findings below may reference images not displayed]

FINDINGS: Lung volumes are low. Borderline cardiomegaly unchanged from prior
exam. Unchanged mediastinal contours. There is peribronchial
thickening. No focal airspace disease. No pneumothorax or pleural
effusion. No acute osseous abnormalities are seen. Degenerative
change in the spine.
IMPRESSION: Low lung volumes with peribronchial thickening.

## 2021-04-02 DIAGNOSIS — R0609 Other forms of dyspnea: Secondary | ICD-10-CM | POA: Diagnosis not present

## 2021-04-02 DIAGNOSIS — I4891 Unspecified atrial fibrillation: Secondary | ICD-10-CM | POA: Diagnosis not present

## 2021-04-02 DIAGNOSIS — I5030 Unspecified diastolic (congestive) heart failure: Secondary | ICD-10-CM | POA: Diagnosis not present

## 2021-04-02 DIAGNOSIS — I110 Hypertensive heart disease with heart failure: Secondary | ICD-10-CM | POA: Diagnosis not present

## 2021-04-02 DIAGNOSIS — E785 Hyperlipidemia, unspecified: Secondary | ICD-10-CM | POA: Diagnosis not present

## 2021-04-02 DIAGNOSIS — E7800 Pure hypercholesterolemia, unspecified: Secondary | ICD-10-CM | POA: Diagnosis not present

## 2021-04-02 DIAGNOSIS — E782 Mixed hyperlipidemia: Secondary | ICD-10-CM | POA: Diagnosis not present

## 2021-04-02 DIAGNOSIS — I5032 Chronic diastolic (congestive) heart failure: Secondary | ICD-10-CM | POA: Diagnosis not present

## 2021-04-09 DIAGNOSIS — I480 Paroxysmal atrial fibrillation: Secondary | ICD-10-CM | POA: Diagnosis not present

## 2021-04-09 DIAGNOSIS — I5032 Chronic diastolic (congestive) heart failure: Secondary | ICD-10-CM | POA: Diagnosis not present

## 2021-04-14 DIAGNOSIS — I5032 Chronic diastolic (congestive) heart failure: Secondary | ICD-10-CM | POA: Diagnosis not present

## 2021-05-05 DIAGNOSIS — Z20822 Contact with and (suspected) exposure to covid-19: Secondary | ICD-10-CM | POA: Diagnosis not present

## 2021-05-06 DIAGNOSIS — I5032 Chronic diastolic (congestive) heart failure: Secondary | ICD-10-CM | POA: Diagnosis not present

## 2021-05-06 DIAGNOSIS — Z299 Encounter for prophylactic measures, unspecified: Secondary | ICD-10-CM | POA: Diagnosis not present

## 2021-05-06 DIAGNOSIS — I10 Essential (primary) hypertension: Secondary | ICD-10-CM | POA: Diagnosis not present

## 2021-05-06 DIAGNOSIS — I480 Paroxysmal atrial fibrillation: Secondary | ICD-10-CM | POA: Diagnosis not present

## 2021-05-06 DIAGNOSIS — I4891 Unspecified atrial fibrillation: Secondary | ICD-10-CM | POA: Diagnosis not present

## 2021-05-06 DIAGNOSIS — J069 Acute upper respiratory infection, unspecified: Secondary | ICD-10-CM | POA: Diagnosis not present

## 2021-05-20 ENCOUNTER — Ambulatory Visit: Payer: Medicare Other | Admitting: Allergy

## 2021-06-25 DIAGNOSIS — I110 Hypertensive heart disease with heart failure: Secondary | ICD-10-CM | POA: Diagnosis not present

## 2021-06-25 DIAGNOSIS — I480 Paroxysmal atrial fibrillation: Secondary | ICD-10-CM | POA: Diagnosis not present

## 2021-06-25 DIAGNOSIS — I10 Essential (primary) hypertension: Secondary | ICD-10-CM | POA: Diagnosis not present

## 2021-06-25 DIAGNOSIS — R7303 Prediabetes: Secondary | ICD-10-CM | POA: Diagnosis not present

## 2021-06-25 DIAGNOSIS — Z6841 Body Mass Index (BMI) 40.0 and over, adult: Secondary | ICD-10-CM | POA: Diagnosis not present

## 2021-06-25 DIAGNOSIS — I5032 Chronic diastolic (congestive) heart failure: Secondary | ICD-10-CM | POA: Diagnosis not present

## 2021-07-28 DIAGNOSIS — Z6841 Body Mass Index (BMI) 40.0 and over, adult: Secondary | ICD-10-CM | POA: Diagnosis not present

## 2021-07-28 DIAGNOSIS — Z299 Encounter for prophylactic measures, unspecified: Secondary | ICD-10-CM | POA: Diagnosis not present

## 2021-07-28 DIAGNOSIS — E559 Vitamin D deficiency, unspecified: Secondary | ICD-10-CM | POA: Diagnosis not present

## 2021-07-28 DIAGNOSIS — Z1331 Encounter for screening for depression: Secondary | ICD-10-CM | POA: Diagnosis not present

## 2021-07-28 DIAGNOSIS — Z79899 Other long term (current) drug therapy: Secondary | ICD-10-CM | POA: Diagnosis not present

## 2021-07-28 DIAGNOSIS — Z0000 Encounter for general adult medical examination without abnormal findings: Secondary | ICD-10-CM | POA: Diagnosis not present

## 2021-07-28 DIAGNOSIS — Z7189 Other specified counseling: Secondary | ICD-10-CM | POA: Diagnosis not present

## 2021-07-28 DIAGNOSIS — Z1339 Encounter for screening examination for other mental health and behavioral disorders: Secondary | ICD-10-CM | POA: Diagnosis not present

## 2021-07-28 DIAGNOSIS — Z789 Other specified health status: Secondary | ICD-10-CM | POA: Diagnosis not present

## 2021-07-28 DIAGNOSIS — I4891 Unspecified atrial fibrillation: Secondary | ICD-10-CM | POA: Diagnosis not present

## 2021-07-28 DIAGNOSIS — I10 Essential (primary) hypertension: Secondary | ICD-10-CM | POA: Diagnosis not present

## 2021-08-06 DIAGNOSIS — M1711 Unilateral primary osteoarthritis, right knee: Secondary | ICD-10-CM | POA: Diagnosis not present

## 2021-08-11 DIAGNOSIS — E2839 Other primary ovarian failure: Secondary | ICD-10-CM | POA: Diagnosis not present

## 2021-08-12 DIAGNOSIS — K5730 Diverticulosis of large intestine without perforation or abscess without bleeding: Secondary | ICD-10-CM | POA: Diagnosis not present

## 2021-08-12 DIAGNOSIS — Z1211 Encounter for screening for malignant neoplasm of colon: Secondary | ICD-10-CM | POA: Diagnosis not present

## 2021-08-12 DIAGNOSIS — K219 Gastro-esophageal reflux disease without esophagitis: Secondary | ICD-10-CM | POA: Diagnosis not present

## 2021-08-12 DIAGNOSIS — R748 Abnormal levels of other serum enzymes: Secondary | ICD-10-CM | POA: Diagnosis not present

## 2021-08-13 DIAGNOSIS — M1711 Unilateral primary osteoarthritis, right knee: Secondary | ICD-10-CM | POA: Diagnosis not present

## 2021-08-25 DIAGNOSIS — Z1211 Encounter for screening for malignant neoplasm of colon: Secondary | ICD-10-CM | POA: Diagnosis not present

## 2021-08-25 DIAGNOSIS — Z1212 Encounter for screening for malignant neoplasm of rectum: Secondary | ICD-10-CM | POA: Diagnosis not present

## 2021-09-23 DIAGNOSIS — I5032 Chronic diastolic (congestive) heart failure: Secondary | ICD-10-CM | POA: Diagnosis not present

## 2021-09-23 DIAGNOSIS — Z6841 Body Mass Index (BMI) 40.0 and over, adult: Secondary | ICD-10-CM | POA: Diagnosis not present

## 2021-09-23 DIAGNOSIS — I480 Paroxysmal atrial fibrillation: Secondary | ICD-10-CM | POA: Diagnosis not present

## 2021-09-23 DIAGNOSIS — I110 Hypertensive heart disease with heart failure: Secondary | ICD-10-CM | POA: Diagnosis not present

## 2021-09-23 DIAGNOSIS — E782 Mixed hyperlipidemia: Secondary | ICD-10-CM | POA: Diagnosis not present

## 2021-09-24 DIAGNOSIS — M1711 Unilateral primary osteoarthritis, right knee: Secondary | ICD-10-CM | POA: Diagnosis not present

## 2021-10-03 ENCOUNTER — Other Ambulatory Visit (HOSPITAL_COMMUNITY): Payer: Self-pay | Admitting: Gastroenterology

## 2021-10-03 ENCOUNTER — Other Ambulatory Visit: Payer: Self-pay | Admitting: Gastroenterology

## 2021-10-03 DIAGNOSIS — R748 Abnormal levels of other serum enzymes: Secondary | ICD-10-CM

## 2021-10-21 ENCOUNTER — Other Ambulatory Visit: Payer: Self-pay | Admitting: Student

## 2021-10-21 DIAGNOSIS — R748 Abnormal levels of other serum enzymes: Secondary | ICD-10-CM

## 2021-10-22 ENCOUNTER — Ambulatory Visit (HOSPITAL_COMMUNITY)
Admission: RE | Admit: 2021-10-22 | Discharge: 2021-10-22 | Disposition: A | Discharge status: Home / Self Care | Payer: Medicare Other | Source: Ambulatory Visit | Attending: Gastroenterology | Admitting: Gastroenterology

## 2021-10-22 DIAGNOSIS — R748 Abnormal levels of other serum enzymes: Secondary | ICD-10-CM | POA: Insufficient documentation

## 2021-10-22 DIAGNOSIS — Z79899 Other long term (current) drug therapy: Secondary | ICD-10-CM | POA: Diagnosis not present

## 2021-10-22 LAB — CBC
HCT: 43.7 % (ref 36.0–46.0)
Hemoglobin: 14.4 g/dL (ref 12.0–15.0)
MCH: 31.9 pg (ref 26.0–34.0)
MCHC: 33 g/dL (ref 30.0–36.0)
MCV: 96.7 fL (ref 80.0–100.0)
Platelets: 229 10*3/uL (ref 150–400)
RBC: 4.52 MIL/uL (ref 3.87–5.11)
RDW: 13.2 % (ref 11.5–15.5)
WBC: 4.8 10*3/uL (ref 4.0–10.5)
nRBC: 0 % (ref 0.0–0.2)

## 2021-10-22 LAB — GLUCOSE, CAPILLARY: Glucose-Capillary: 83 mg/dL (ref 70–99)

## 2021-10-22 MED ORDER — SODIUM CHLORIDE 0.9 % IV SOLN: INTRAVENOUS | Status: DC

## 2021-10-22 NOTE — H&P (Addendum)
Patient presented to [facility] today for outpatient random liver biopsy due to persistently elevated alkaline phosphatase. Per chart review alkaline phosphatase noted to be 288 (07/15/20), 117 (04/02/21) and 103 (09/23/21) - patient was instructed to stop atorvastatin by Dr. Collene Mares during follow up visit on 08/12/21. Labs drawn 09/23/21 at [facility] showed normal alkaline phosphatase.   Patient stated she was aware her alkaline phosphatase was now normal however she had been reading online that she could have autoimmune hepatitis if her anti-smooth muscle test was elevated, she states this was 35 however I cannot see these results in Epic. She states that she does not think Dr. Collene Mares is aware of these labs she had done at [facility] as she did not have copies sent to her office. She presented for biopsy today due to her concern for autoimmune hepatitis. On review of her last visit with Dr. Collene Mares there was no mention of possible autoimmune hepatitis, only of persistent elevated alkaline phosphatase as indication for liver biopsy. She has not restarted her atorvastatin but her cardiologist would like her to do so because her cholesterol is increasing.   Called Dr. Lorie Apley office today and spoke with her assistant as she was in a procedure, after discussion with Dr. Collene Mares her assistant stated that they would like to hold off on liver biopsy at this time.  I relayed this information to the patient who is relieved she does not need a liver biopsy today though she continues to worry about autoimmune hepatitis. She was encouraged to send a copy of her [facility] lab results to Dr. Lorie Apley office and follow up with her regarding need for biopsy.  No procedure planned for today, IR remains available as needed.   Candiss Norse, PA-C

## 2021-11-03 ENCOUNTER — Other Ambulatory Visit: Payer: Self-pay | Admitting: Internal Medicine

## 2021-11-03 DIAGNOSIS — Z1231 Encounter for screening mammogram for malignant neoplasm of breast: Secondary | ICD-10-CM

## 2021-11-12 ENCOUNTER — Ambulatory Visit
Admission: RE | Admit: 2021-11-12 | Discharge: 2021-11-12 | Disposition: A | Discharge status: Home / Self Care | Payer: Medicare Other | Source: Ambulatory Visit | Attending: Internal Medicine | Admitting: Internal Medicine

## 2021-11-12 DIAGNOSIS — Z1231 Encounter for screening mammogram for malignant neoplasm of breast: Secondary | ICD-10-CM | POA: Diagnosis not present

## 2021-12-05 DIAGNOSIS — H35373 Puckering of macula, bilateral: Secondary | ICD-10-CM | POA: Diagnosis not present

## 2021-12-05 DIAGNOSIS — H43811 Vitreous degeneration, right eye: Secondary | ICD-10-CM | POA: Diagnosis not present

## 2021-12-05 DIAGNOSIS — H43391 Other vitreous opacities, right eye: Secondary | ICD-10-CM | POA: Diagnosis not present

## 2021-12-05 DIAGNOSIS — H25813 Combined forms of age-related cataract, bilateral: Secondary | ICD-10-CM | POA: Diagnosis not present

## 2021-12-05 DIAGNOSIS — H21341 Primary cyst of pars plana, right eye: Secondary | ICD-10-CM | POA: Diagnosis not present

## 2021-12-08 DIAGNOSIS — R748 Abnormal levels of other serum enzymes: Secondary | ICD-10-CM | POA: Diagnosis not present

## 2021-12-18 DIAGNOSIS — L708 Other acne: Secondary | ICD-10-CM | POA: Diagnosis not present

## 2021-12-22 DIAGNOSIS — K7400 Hepatic fibrosis, unspecified: Secondary | ICD-10-CM | POA: Diagnosis not present

## 2021-12-22 DIAGNOSIS — R748 Abnormal levels of other serum enzymes: Secondary | ICD-10-CM | POA: Diagnosis not present

## 2022-01-29 DIAGNOSIS — Z6841 Body Mass Index (BMI) 40.0 and over, adult: Secondary | ICD-10-CM | POA: Diagnosis not present

## 2022-01-29 DIAGNOSIS — Z1272 Encounter for screening for malignant neoplasm of vagina: Secondary | ICD-10-CM | POA: Diagnosis not present

## 2022-01-29 DIAGNOSIS — R87620 Atypical squamous cells of undetermined significance on cytologic smear of vagina (ASC-US): Secondary | ICD-10-CM | POA: Diagnosis not present

## 2022-01-29 DIAGNOSIS — Z124 Encounter for screening for malignant neoplasm of cervix: Secondary | ICD-10-CM | POA: Diagnosis not present

## 2022-02-02 DIAGNOSIS — M25512 Pain in left shoulder: Secondary | ICD-10-CM | POA: Diagnosis not present

## 2022-02-09 DIAGNOSIS — R748 Abnormal levels of other serum enzymes: Secondary | ICD-10-CM | POA: Diagnosis not present

## 2022-02-09 DIAGNOSIS — R791 Abnormal coagulation profile: Secondary | ICD-10-CM | POA: Diagnosis not present

## 2022-02-11 DIAGNOSIS — M25512 Pain in left shoulder: Secondary | ICD-10-CM | POA: Diagnosis not present

## 2022-02-16 DIAGNOSIS — M170 Bilateral primary osteoarthritis of knee: Secondary | ICD-10-CM | POA: Diagnosis not present

## 2022-02-23 DIAGNOSIS — M1711 Unilateral primary osteoarthritis, right knee: Secondary | ICD-10-CM | POA: Diagnosis not present

## 2022-02-23 DIAGNOSIS — M75122 Complete rotator cuff tear or rupture of left shoulder, not specified as traumatic: Secondary | ICD-10-CM | POA: Diagnosis not present

## 2022-02-27 DIAGNOSIS — M25512 Pain in left shoulder: Secondary | ICD-10-CM | POA: Diagnosis not present

## 2022-02-27 DIAGNOSIS — M25612 Stiffness of left shoulder, not elsewhere classified: Secondary | ICD-10-CM | POA: Diagnosis not present

## 2022-03-06 DIAGNOSIS — M25512 Pain in left shoulder: Secondary | ICD-10-CM | POA: Diagnosis not present

## 2022-03-06 DIAGNOSIS — M25612 Stiffness of left shoulder, not elsewhere classified: Secondary | ICD-10-CM | POA: Diagnosis not present

## 2022-03-11 DIAGNOSIS — M25512 Pain in left shoulder: Secondary | ICD-10-CM | POA: Diagnosis not present

## 2022-03-11 DIAGNOSIS — M25612 Stiffness of left shoulder, not elsewhere classified: Secondary | ICD-10-CM | POA: Diagnosis not present

## 2022-03-16 DIAGNOSIS — M25612 Stiffness of left shoulder, not elsewhere classified: Secondary | ICD-10-CM | POA: Diagnosis not present

## 2022-03-16 DIAGNOSIS — M25512 Pain in left shoulder: Secondary | ICD-10-CM | POA: Diagnosis not present

## 2022-03-23 DIAGNOSIS — M25612 Stiffness of left shoulder, not elsewhere classified: Secondary | ICD-10-CM | POA: Diagnosis not present

## 2022-03-23 DIAGNOSIS — M25512 Pain in left shoulder: Secondary | ICD-10-CM | POA: Diagnosis not present

## 2022-03-25 DIAGNOSIS — M25612 Stiffness of left shoulder, not elsewhere classified: Secondary | ICD-10-CM | POA: Diagnosis not present

## 2022-03-25 DIAGNOSIS — M25512 Pain in left shoulder: Secondary | ICD-10-CM | POA: Diagnosis not present

## 2022-03-26 DIAGNOSIS — N179 Acute kidney failure, unspecified: Secondary | ICD-10-CM | POA: Diagnosis not present

## 2022-03-26 DIAGNOSIS — Z7984 Long term (current) use of oral hypoglycemic drugs: Secondary | ICD-10-CM | POA: Diagnosis not present

## 2022-03-26 DIAGNOSIS — Z79899 Other long term (current) drug therapy: Secondary | ICD-10-CM | POA: Diagnosis not present

## 2022-03-26 DIAGNOSIS — Z6841 Body Mass Index (BMI) 40.0 and over, adult: Secondary | ICD-10-CM | POA: Diagnosis not present

## 2022-03-26 DIAGNOSIS — Z7985 Long-term (current) use of injectable non-insulin antidiabetic drugs: Secondary | ICD-10-CM | POA: Diagnosis not present

## 2022-03-26 DIAGNOSIS — E782 Mixed hyperlipidemia: Secondary | ICD-10-CM | POA: Diagnosis not present

## 2022-03-26 DIAGNOSIS — I471 Supraventricular tachycardia, unspecified: Secondary | ICD-10-CM | POA: Diagnosis not present

## 2022-03-26 DIAGNOSIS — Z7901 Long term (current) use of anticoagulants: Secondary | ICD-10-CM | POA: Diagnosis not present

## 2022-03-26 DIAGNOSIS — R7303 Prediabetes: Secondary | ICD-10-CM | POA: Diagnosis not present

## 2022-03-26 DIAGNOSIS — Z23 Encounter for immunization: Secondary | ICD-10-CM | POA: Diagnosis not present

## 2022-03-26 DIAGNOSIS — I4891 Unspecified atrial fibrillation: Secondary | ICD-10-CM | POA: Diagnosis not present

## 2022-03-26 DIAGNOSIS — I5032 Chronic diastolic (congestive) heart failure: Secondary | ICD-10-CM | POA: Diagnosis not present

## 2022-03-26 DIAGNOSIS — I110 Hypertensive heart disease with heart failure: Secondary | ICD-10-CM | POA: Diagnosis not present

## 2022-03-30 DIAGNOSIS — M25512 Pain in left shoulder: Secondary | ICD-10-CM | POA: Diagnosis not present

## 2022-03-30 DIAGNOSIS — M25612 Stiffness of left shoulder, not elsewhere classified: Secondary | ICD-10-CM | POA: Diagnosis not present

## 2022-04-03 DIAGNOSIS — M25612 Stiffness of left shoulder, not elsewhere classified: Secondary | ICD-10-CM | POA: Diagnosis not present

## 2022-04-03 DIAGNOSIS — M25512 Pain in left shoulder: Secondary | ICD-10-CM | POA: Diagnosis not present

## 2022-04-06 DIAGNOSIS — M25512 Pain in left shoulder: Secondary | ICD-10-CM | POA: Diagnosis not present

## 2022-04-06 DIAGNOSIS — M25612 Stiffness of left shoulder, not elsewhere classified: Secondary | ICD-10-CM | POA: Diagnosis not present

## 2022-04-08 DIAGNOSIS — H2513 Age-related nuclear cataract, bilateral: Secondary | ICD-10-CM | POA: Diagnosis not present

## 2022-04-08 DIAGNOSIS — M75122 Complete rotator cuff tear or rupture of left shoulder, not specified as traumatic: Secondary | ICD-10-CM | POA: Diagnosis not present

## 2022-04-13 DIAGNOSIS — M1711 Unilateral primary osteoarthritis, right knee: Secondary | ICD-10-CM | POA: Diagnosis not present

## 2022-04-30 DIAGNOSIS — M75122 Complete rotator cuff tear or rupture of left shoulder, not specified as traumatic: Secondary | ICD-10-CM | POA: Diagnosis not present

## 2022-05-06 DIAGNOSIS — M25512 Pain in left shoulder: Secondary | ICD-10-CM | POA: Diagnosis not present

## 2022-06-19 DIAGNOSIS — M79671 Pain in right foot: Secondary | ICD-10-CM | POA: Diagnosis not present

## 2022-06-19 DIAGNOSIS — G5761 Lesion of plantar nerve, right lower limb: Secondary | ICD-10-CM | POA: Diagnosis not present

## 2022-06-19 DIAGNOSIS — M21621 Bunionette of right foot: Secondary | ICD-10-CM | POA: Diagnosis not present

## 2022-06-19 DIAGNOSIS — M7751 Other enthesopathy of right foot: Secondary | ICD-10-CM | POA: Diagnosis not present

## 2022-06-25 DIAGNOSIS — R748 Abnormal levels of other serum enzymes: Secondary | ICD-10-CM | POA: Diagnosis not present

## 2022-07-07 DIAGNOSIS — L84 Corns and callosities: Secondary | ICD-10-CM | POA: Diagnosis not present

## 2022-07-07 DIAGNOSIS — I5032 Chronic diastolic (congestive) heart failure: Secondary | ICD-10-CM | POA: Diagnosis not present

## 2022-07-07 DIAGNOSIS — I4891 Unspecified atrial fibrillation: Secondary | ICD-10-CM | POA: Diagnosis not present

## 2022-07-07 DIAGNOSIS — D6869 Other thrombophilia: Secondary | ICD-10-CM | POA: Diagnosis not present

## 2022-07-07 DIAGNOSIS — Z6841 Body Mass Index (BMI) 40.0 and over, adult: Secondary | ICD-10-CM | POA: Diagnosis not present

## 2022-07-17 DIAGNOSIS — M19071 Primary osteoarthritis, right ankle and foot: Secondary | ICD-10-CM | POA: Diagnosis not present

## 2022-07-17 DIAGNOSIS — M792 Neuralgia and neuritis, unspecified: Secondary | ICD-10-CM | POA: Diagnosis not present

## 2022-07-17 DIAGNOSIS — L84 Corns and callosities: Secondary | ICD-10-CM | POA: Diagnosis not present

## 2022-07-17 DIAGNOSIS — M19072 Primary osteoarthritis, left ankle and foot: Secondary | ICD-10-CM | POA: Diagnosis not present

## 2022-07-17 DIAGNOSIS — I739 Peripheral vascular disease, unspecified: Secondary | ICD-10-CM | POA: Diagnosis not present

## 2022-09-16 DIAGNOSIS — M19072 Primary osteoarthritis, left ankle and foot: Secondary | ICD-10-CM | POA: Diagnosis not present

## 2022-09-16 DIAGNOSIS — I739 Peripheral vascular disease, unspecified: Secondary | ICD-10-CM | POA: Diagnosis not present

## 2022-09-16 DIAGNOSIS — L84 Corns and callosities: Secondary | ICD-10-CM | POA: Diagnosis not present

## 2022-09-16 DIAGNOSIS — L851 Acquired keratosis [keratoderma] palmaris et plantaris: Secondary | ICD-10-CM | POA: Diagnosis not present

## 2022-09-16 DIAGNOSIS — M19071 Primary osteoarthritis, right ankle and foot: Secondary | ICD-10-CM | POA: Diagnosis not present

## 2022-09-16 DIAGNOSIS — M792 Neuralgia and neuritis, unspecified: Secondary | ICD-10-CM | POA: Diagnosis not present

## 2022-09-17 DIAGNOSIS — M75102 Unspecified rotator cuff tear or rupture of left shoulder, not specified as traumatic: Secondary | ICD-10-CM | POA: Diagnosis not present

## 2022-09-17 DIAGNOSIS — M19012 Primary osteoarthritis, left shoulder: Secondary | ICD-10-CM | POA: Diagnosis not present

## 2022-09-24 DIAGNOSIS — I110 Hypertensive heart disease with heart failure: Secondary | ICD-10-CM | POA: Diagnosis not present

## 2022-09-24 DIAGNOSIS — I4891 Unspecified atrial fibrillation: Secondary | ICD-10-CM | POA: Diagnosis not present

## 2022-09-24 DIAGNOSIS — I5032 Chronic diastolic (congestive) heart failure: Secondary | ICD-10-CM | POA: Diagnosis not present

## 2022-09-24 DIAGNOSIS — I10 Essential (primary) hypertension: Secondary | ICD-10-CM | POA: Diagnosis not present

## 2022-09-24 DIAGNOSIS — R7303 Prediabetes: Secondary | ICD-10-CM | POA: Diagnosis not present

## 2022-09-24 DIAGNOSIS — Z6841 Body Mass Index (BMI) 40.0 and over, adult: Secondary | ICD-10-CM | POA: Diagnosis not present

## 2022-10-26 DIAGNOSIS — M25512 Pain in left shoulder: Secondary | ICD-10-CM | POA: Diagnosis not present

## 2022-11-02 DIAGNOSIS — M75102 Unspecified rotator cuff tear or rupture of left shoulder, not specified as traumatic: Secondary | ICD-10-CM | POA: Diagnosis not present

## 2022-11-03 DIAGNOSIS — M25512 Pain in left shoulder: Secondary | ICD-10-CM | POA: Diagnosis not present

## 2022-11-05 DIAGNOSIS — M25512 Pain in left shoulder: Secondary | ICD-10-CM | POA: Diagnosis not present

## 2022-11-10 DIAGNOSIS — M25512 Pain in left shoulder: Secondary | ICD-10-CM | POA: Diagnosis not present

## 2022-11-12 DIAGNOSIS — M25512 Pain in left shoulder: Secondary | ICD-10-CM | POA: Diagnosis not present

## 2022-11-16 DIAGNOSIS — M25512 Pain in left shoulder: Secondary | ICD-10-CM | POA: Diagnosis not present

## 2022-11-30 DIAGNOSIS — M25572 Pain in left ankle and joints of left foot: Secondary | ICD-10-CM | POA: Diagnosis not present

## 2022-11-30 DIAGNOSIS — M79672 Pain in left foot: Secondary | ICD-10-CM | POA: Diagnosis not present

## 2022-11-30 DIAGNOSIS — M545 Low back pain, unspecified: Secondary | ICD-10-CM | POA: Diagnosis not present

## 2022-12-14 DIAGNOSIS — I5032 Chronic diastolic (congestive) heart failure: Secondary | ICD-10-CM | POA: Diagnosis not present

## 2022-12-14 DIAGNOSIS — I4891 Unspecified atrial fibrillation: Secondary | ICD-10-CM | POA: Diagnosis not present

## 2022-12-14 DIAGNOSIS — U071 COVID-19: Secondary | ICD-10-CM | POA: Diagnosis not present

## 2022-12-14 DIAGNOSIS — D6869 Other thrombophilia: Secondary | ICD-10-CM | POA: Diagnosis not present

## 2022-12-16 IMAGING — MG MM DIGITAL SCREENING BILAT W/ TOMO AND CAD
6 of 12 series · 6 of 36 positions shown · non-contrast
Comparison: Previous exam(s).

CLINICAL DATA: Screening.

EXAM:
DIGITAL SCREENING BILATERAL MAMMOGRAM WITH TOMOSYNTHESIS AND CAD
TECHNIQUE: Bilateral screening digital craniocaudal and mediolateral oblique
mammograms were obtained. Bilateral screening digital breast
tomosynthesis was performed. The images were evaluated with
computer-aided detection.

[L MLO synth-2D (1 of 2)]
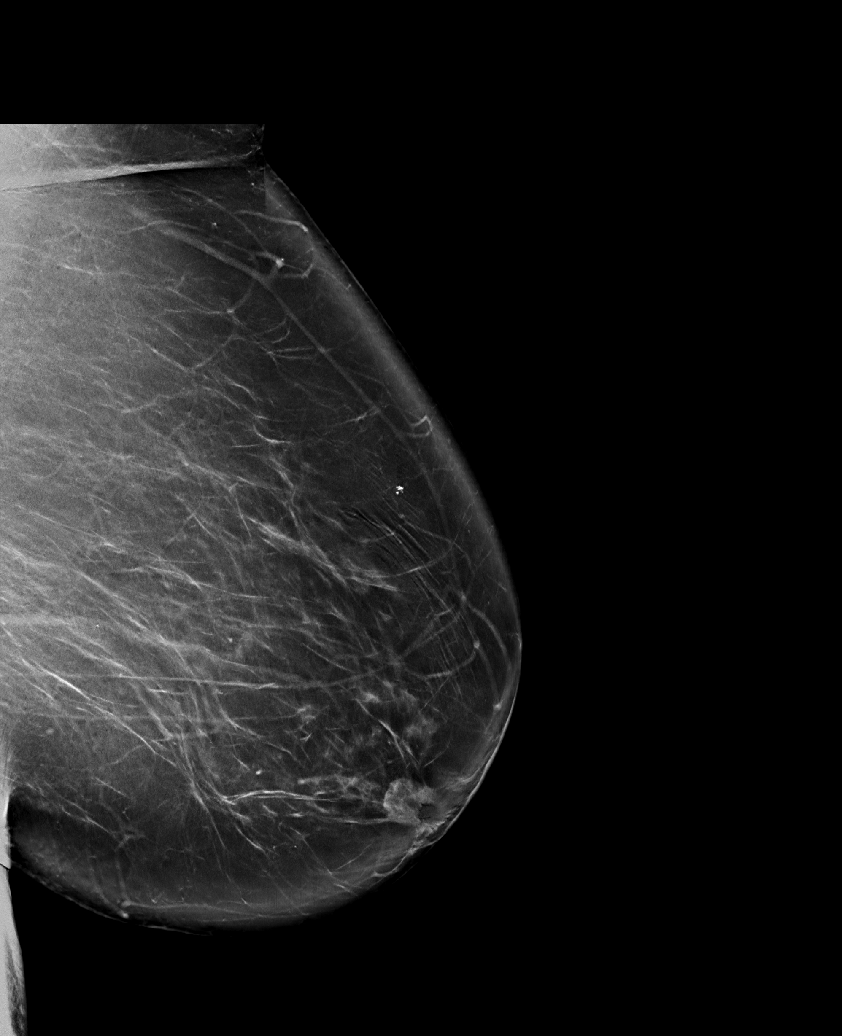

[L CC synth-2D (1 of 2)]
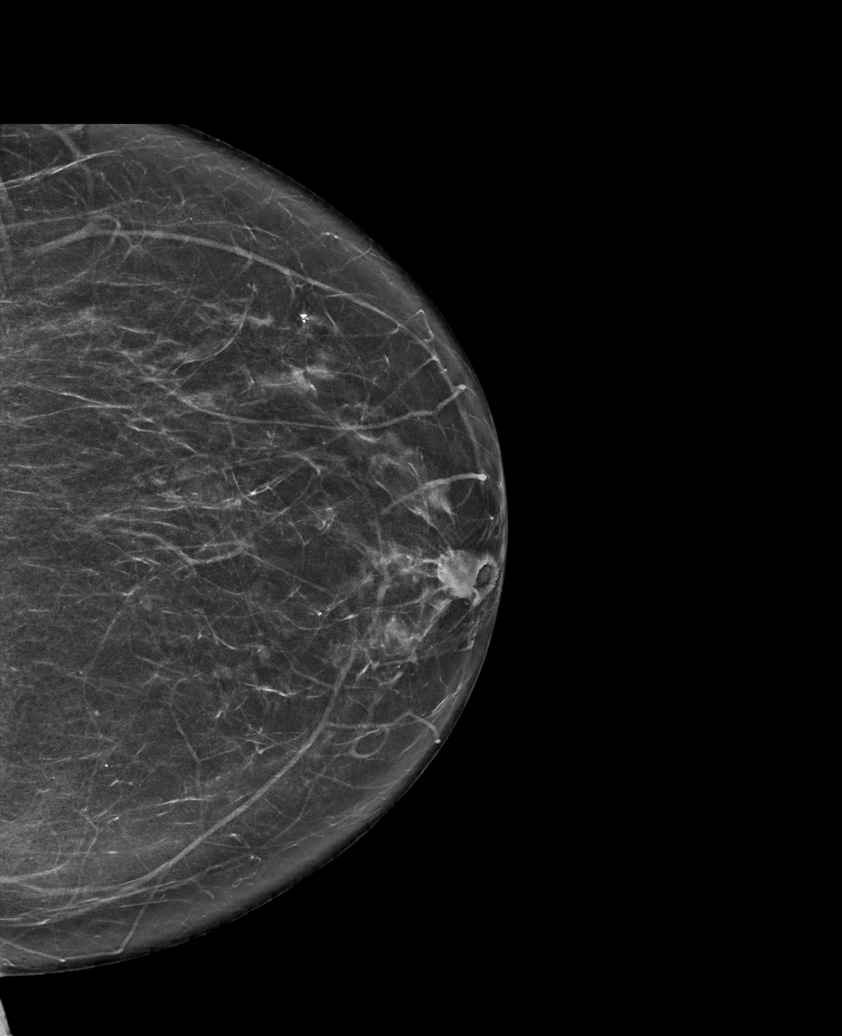

[L MLO synth-2D (2 of 2)]
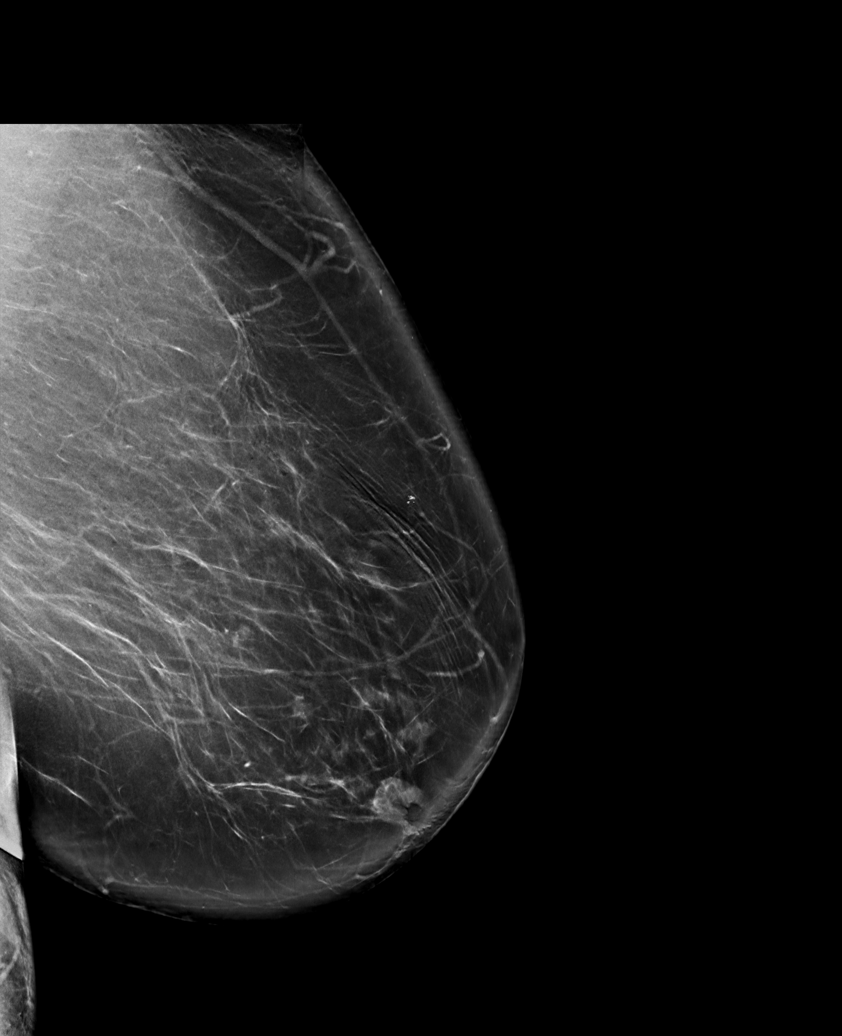

[R CC synth-2D]
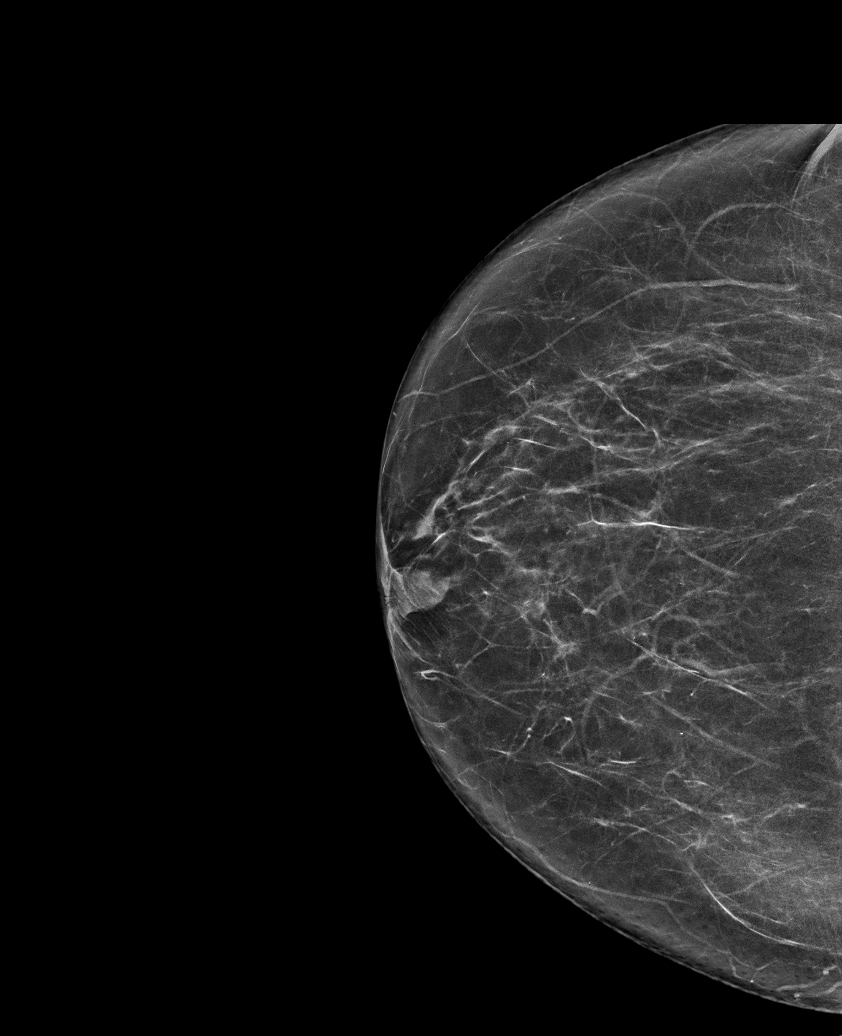

[R MLO synth-2D]
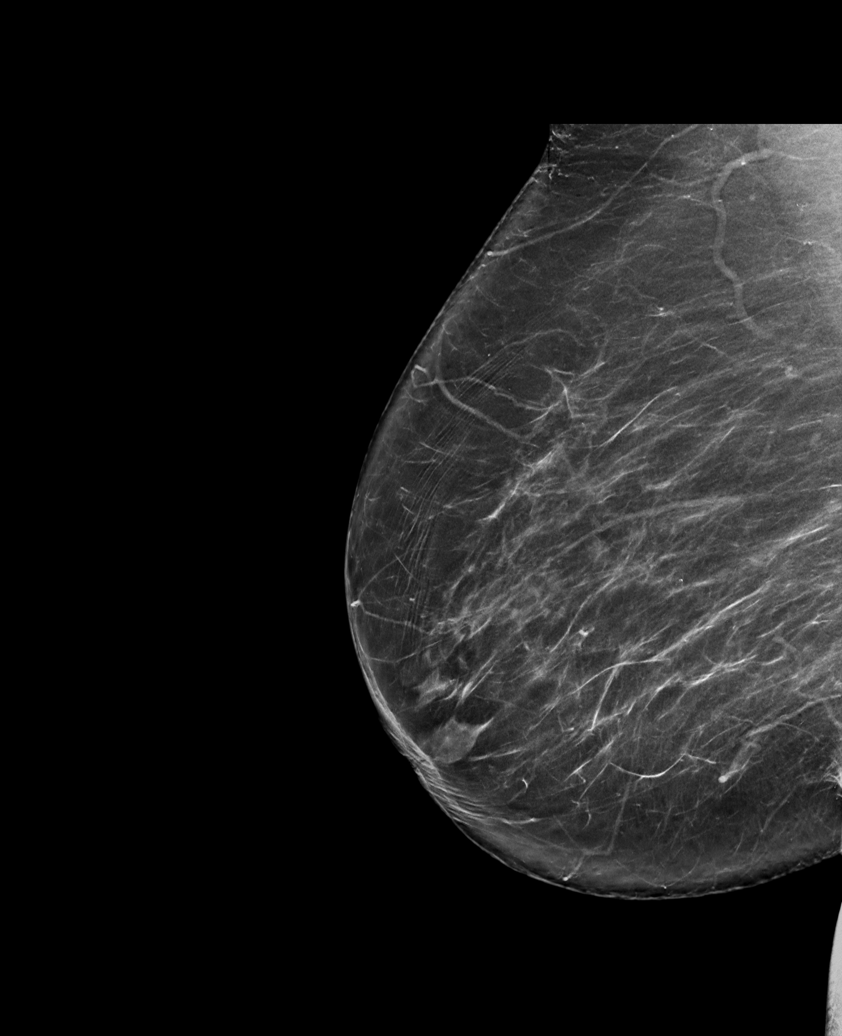

[L CC synth-2D (2 of 2)]
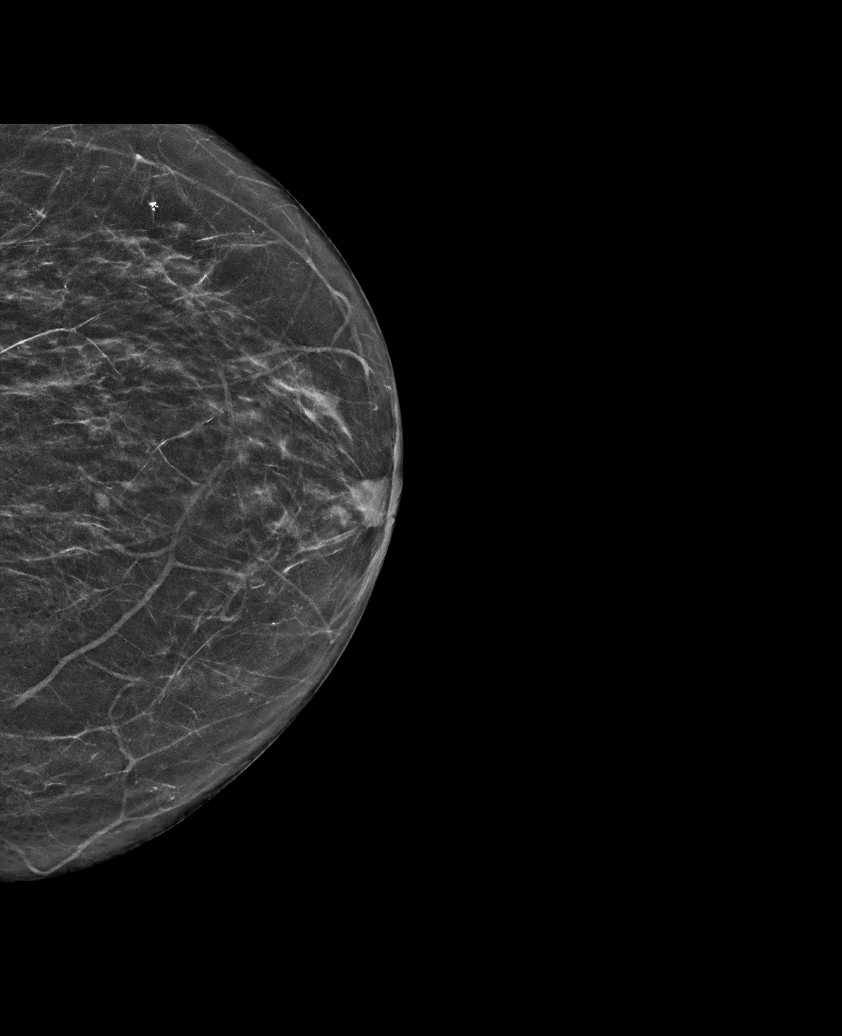

[6 of 36 positions shown; findings below may reference images not displayed]

ACR Breast Density Category b: There are scattered areas of
fibroglandular density.
FINDINGS: There are no findings suspicious for malignancy.
IMPRESSION: No mammographic evidence of malignancy. A result letter of this
screening mammogram will be mailed directly to the patient.

RECOMMENDATION:
Screening mammogram in one year. (Code:51-O-LD2)

BI-RADS CATEGORY  1: Negative.

## 2023-01-19 DIAGNOSIS — Z1331 Encounter for screening for depression: Secondary | ICD-10-CM | POA: Diagnosis not present

## 2023-01-19 DIAGNOSIS — R5383 Other fatigue: Secondary | ICD-10-CM | POA: Diagnosis not present

## 2023-01-19 DIAGNOSIS — Z6841 Body Mass Index (BMI) 40.0 and over, adult: Secondary | ICD-10-CM | POA: Diagnosis not present

## 2023-01-19 DIAGNOSIS — Z79899 Other long term (current) drug therapy: Secondary | ICD-10-CM | POA: Diagnosis not present

## 2023-01-19 DIAGNOSIS — I10 Essential (primary) hypertension: Secondary | ICD-10-CM | POA: Diagnosis not present

## 2023-01-19 DIAGNOSIS — Z299 Encounter for prophylactic measures, unspecified: Secondary | ICD-10-CM | POA: Diagnosis not present

## 2023-01-19 DIAGNOSIS — Z1339 Encounter for screening examination for other mental health and behavioral disorders: Secondary | ICD-10-CM | POA: Diagnosis not present

## 2023-01-19 DIAGNOSIS — Z0000 Encounter for general adult medical examination without abnormal findings: Secondary | ICD-10-CM | POA: Diagnosis not present

## 2023-01-19 DIAGNOSIS — Z7189 Other specified counseling: Secondary | ICD-10-CM | POA: Diagnosis not present

## 2023-01-19 DIAGNOSIS — E7800 Pure hypercholesterolemia, unspecified: Secondary | ICD-10-CM | POA: Diagnosis not present

## 2023-01-20 DIAGNOSIS — R5383 Other fatigue: Secondary | ICD-10-CM | POA: Diagnosis not present

## 2023-01-20 DIAGNOSIS — E7800 Pure hypercholesterolemia, unspecified: Secondary | ICD-10-CM | POA: Diagnosis not present

## 2023-01-20 DIAGNOSIS — Z79899 Other long term (current) drug therapy: Secondary | ICD-10-CM | POA: Diagnosis not present

## 2023-02-01 DIAGNOSIS — I10 Essential (primary) hypertension: Secondary | ICD-10-CM | POA: Diagnosis not present

## 2023-02-01 DIAGNOSIS — E782 Mixed hyperlipidemia: Secondary | ICD-10-CM | POA: Diagnosis not present

## 2023-02-01 DIAGNOSIS — I5032 Chronic diastolic (congestive) heart failure: Secondary | ICD-10-CM | POA: Diagnosis not present

## 2023-02-01 DIAGNOSIS — I480 Paroxysmal atrial fibrillation: Secondary | ICD-10-CM | POA: Diagnosis not present

## 2023-04-15 DIAGNOSIS — I5032 Chronic diastolic (congestive) heart failure: Secondary | ICD-10-CM | POA: Diagnosis not present

## 2023-04-15 DIAGNOSIS — E782 Mixed hyperlipidemia: Secondary | ICD-10-CM | POA: Diagnosis not present

## 2023-04-15 DIAGNOSIS — I480 Paroxysmal atrial fibrillation: Secondary | ICD-10-CM | POA: Diagnosis not present

## 2023-04-15 DIAGNOSIS — E66813 Obesity, class 3: Secondary | ICD-10-CM | POA: Diagnosis not present

## 2023-04-15 DIAGNOSIS — Z6841 Body Mass Index (BMI) 40.0 and over, adult: Secondary | ICD-10-CM | POA: Diagnosis not present

## 2023-04-15 DIAGNOSIS — I110 Hypertensive heart disease with heart failure: Secondary | ICD-10-CM | POA: Diagnosis not present

## 2023-05-14 DIAGNOSIS — M25512 Pain in left shoulder: Secondary | ICD-10-CM | POA: Diagnosis not present

## 2023-05-14 DIAGNOSIS — M1711 Unilateral primary osteoarthritis, right knee: Secondary | ICD-10-CM | POA: Diagnosis not present

## 2023-05-18 DIAGNOSIS — M1711 Unilateral primary osteoarthritis, right knee: Secondary | ICD-10-CM | POA: Diagnosis not present

## 2023-05-25 DIAGNOSIS — M1711 Unilateral primary osteoarthritis, right knee: Secondary | ICD-10-CM | POA: Diagnosis not present

## 2023-07-06 DIAGNOSIS — M1711 Unilateral primary osteoarthritis, right knee: Secondary | ICD-10-CM | POA: Diagnosis not present

## 2023-07-23 DIAGNOSIS — I4891 Unspecified atrial fibrillation: Secondary | ICD-10-CM | POA: Diagnosis not present

## 2023-07-23 DIAGNOSIS — D6869 Other thrombophilia: Secondary | ICD-10-CM | POA: Diagnosis not present

## 2023-07-23 DIAGNOSIS — E261 Secondary hyperaldosteronism: Secondary | ICD-10-CM | POA: Diagnosis not present

## 2023-07-23 DIAGNOSIS — I5032 Chronic diastolic (congestive) heart failure: Secondary | ICD-10-CM | POA: Diagnosis not present

## 2023-07-23 DIAGNOSIS — I10 Essential (primary) hypertension: Secondary | ICD-10-CM | POA: Diagnosis not present

## 2023-07-23 DIAGNOSIS — Z299 Encounter for prophylactic measures, unspecified: Secondary | ICD-10-CM | POA: Diagnosis not present

## 2023-08-13 ENCOUNTER — Other Ambulatory Visit: Payer: Self-pay | Admitting: Internal Medicine

## 2023-08-13 DIAGNOSIS — Z1231 Encounter for screening mammogram for malignant neoplasm of breast: Secondary | ICD-10-CM

## 2023-08-23 DIAGNOSIS — H52203 Unspecified astigmatism, bilateral: Secondary | ICD-10-CM | POA: Diagnosis not present

## 2023-08-23 DIAGNOSIS — E119 Type 2 diabetes mellitus without complications: Secondary | ICD-10-CM | POA: Diagnosis not present

## 2023-08-23 DIAGNOSIS — H2513 Age-related nuclear cataract, bilateral: Secondary | ICD-10-CM | POA: Diagnosis not present

## 2023-08-24 ENCOUNTER — Ambulatory Visit
Admission: RE | Admit: 2023-08-24 | Discharge: 2023-08-24 | Disposition: A | Discharge status: Home / Self Care | Source: Ambulatory Visit | Attending: Internal Medicine | Admitting: Internal Medicine

## 2023-08-24 DIAGNOSIS — Z1231 Encounter for screening mammogram for malignant neoplasm of breast: Secondary | ICD-10-CM

## 2023-09-16 DIAGNOSIS — H25811 Combined forms of age-related cataract, right eye: Secondary | ICD-10-CM | POA: Diagnosis not present

## 2023-09-16 DIAGNOSIS — H2511 Age-related nuclear cataract, right eye: Secondary | ICD-10-CM | POA: Diagnosis not present

## 2023-09-16 DIAGNOSIS — Z961 Presence of intraocular lens: Secondary | ICD-10-CM | POA: Diagnosis not present

## 2023-09-23 DIAGNOSIS — H25812 Combined forms of age-related cataract, left eye: Secondary | ICD-10-CM | POA: Diagnosis not present

## 2023-09-23 DIAGNOSIS — Z961 Presence of intraocular lens: Secondary | ICD-10-CM | POA: Diagnosis not present

## 2023-09-23 DIAGNOSIS — H2512 Age-related nuclear cataract, left eye: Secondary | ICD-10-CM | POA: Diagnosis not present

## 2023-10-06 DIAGNOSIS — Z6841 Body Mass Index (BMI) 40.0 and over, adult: Secondary | ICD-10-CM | POA: Diagnosis not present

## 2023-10-06 DIAGNOSIS — B009 Herpesviral infection, unspecified: Secondary | ICD-10-CM | POA: Diagnosis not present

## 2023-10-06 DIAGNOSIS — Z124 Encounter for screening for malignant neoplasm of cervix: Secondary | ICD-10-CM | POA: Diagnosis not present

## 2023-10-06 DIAGNOSIS — Z1272 Encounter for screening for malignant neoplasm of vagina: Secondary | ICD-10-CM | POA: Diagnosis not present

## 2023-10-06 DIAGNOSIS — L739 Follicular disorder, unspecified: Secondary | ICD-10-CM | POA: Diagnosis not present

## 2023-10-07 DIAGNOSIS — I480 Paroxysmal atrial fibrillation: Secondary | ICD-10-CM | POA: Diagnosis not present

## 2023-10-07 DIAGNOSIS — Z6841 Body Mass Index (BMI) 40.0 and over, adult: Secondary | ICD-10-CM | POA: Diagnosis not present

## 2023-10-07 DIAGNOSIS — I110 Hypertensive heart disease with heart failure: Secondary | ICD-10-CM | POA: Diagnosis not present

## 2023-10-07 DIAGNOSIS — R7303 Prediabetes: Secondary | ICD-10-CM | POA: Diagnosis not present

## 2023-10-07 DIAGNOSIS — R413 Other amnesia: Secondary | ICD-10-CM | POA: Diagnosis not present

## 2023-10-07 DIAGNOSIS — E782 Mixed hyperlipidemia: Secondary | ICD-10-CM | POA: Diagnosis not present

## 2023-10-07 DIAGNOSIS — I5032 Chronic diastolic (congestive) heart failure: Secondary | ICD-10-CM | POA: Diagnosis not present

## 2023-10-07 DIAGNOSIS — R296 Repeated falls: Secondary | ICD-10-CM | POA: Diagnosis not present

## 2023-10-07 DIAGNOSIS — E66813 Obesity, class 3: Secondary | ICD-10-CM | POA: Diagnosis not present

## 2023-10-07 DIAGNOSIS — R0602 Shortness of breath: Secondary | ICD-10-CM | POA: Diagnosis not present

## 2023-10-12 DIAGNOSIS — M1711 Unilateral primary osteoarthritis, right knee: Secondary | ICD-10-CM | POA: Diagnosis not present

## 2023-10-17 DIAGNOSIS — N23 Unspecified renal colic: Secondary | ICD-10-CM | POA: Diagnosis not present

## 2023-10-17 DIAGNOSIS — R109 Unspecified abdominal pain: Secondary | ICD-10-CM | POA: Diagnosis not present

## 2023-10-18 ENCOUNTER — Emergency Department (HOSPITAL_BASED_OUTPATIENT_CLINIC_OR_DEPARTMENT_OTHER)

## 2023-10-18 ENCOUNTER — Other Ambulatory Visit: Payer: Self-pay

## 2023-10-18 ENCOUNTER — Emergency Department (HOSPITAL_BASED_OUTPATIENT_CLINIC_OR_DEPARTMENT_OTHER)
Admission: EM | Admit: 2023-10-18 | Discharge: 2023-10-18 | Disposition: A | Discharge status: Home / Self Care | Attending: Emergency Medicine | Admitting: Emergency Medicine

## 2023-10-18 ENCOUNTER — Encounter (HOSPITAL_BASED_OUTPATIENT_CLINIC_OR_DEPARTMENT_OTHER): Payer: Self-pay | Admitting: Emergency Medicine

## 2023-10-18 DIAGNOSIS — I509 Heart failure, unspecified: Secondary | ICD-10-CM | POA: Diagnosis not present

## 2023-10-18 DIAGNOSIS — R600 Localized edema: Secondary | ICD-10-CM | POA: Diagnosis not present

## 2023-10-18 DIAGNOSIS — R079 Chest pain, unspecified: Secondary | ICD-10-CM

## 2023-10-18 DIAGNOSIS — Z79899 Other long term (current) drug therapy: Secondary | ICD-10-CM | POA: Insufficient documentation

## 2023-10-18 DIAGNOSIS — R7989 Other specified abnormal findings of blood chemistry: Secondary | ICD-10-CM | POA: Insufficient documentation

## 2023-10-18 DIAGNOSIS — Z7901 Long term (current) use of anticoagulants: Secondary | ICD-10-CM | POA: Insufficient documentation

## 2023-10-18 DIAGNOSIS — R072 Precordial pain: Secondary | ICD-10-CM | POA: Insufficient documentation

## 2023-10-18 DIAGNOSIS — I110 Hypertensive heart disease with heart failure: Secondary | ICD-10-CM | POA: Diagnosis not present

## 2023-10-18 DIAGNOSIS — R0789 Other chest pain: Secondary | ICD-10-CM | POA: Diagnosis not present

## 2023-10-18 DIAGNOSIS — I700 Atherosclerosis of aorta: Secondary | ICD-10-CM | POA: Diagnosis not present

## 2023-10-18 LAB — CBC
HCT: 46.6 % — ABNORMAL HIGH (ref 36.0–46.0)
Hemoglobin: 15.4 g/dL — ABNORMAL HIGH (ref 12.0–15.0)
MCH: 31.2 pg (ref 26.0–34.0)
MCHC: 33 g/dL (ref 30.0–36.0)
MCV: 94.5 fL (ref 80.0–100.0)
Platelets: 234 10*3/uL (ref 150–400)
RBC: 4.93 MIL/uL (ref 3.87–5.11)
RDW: 13.5 % (ref 11.5–15.5)
WBC: 9.2 10*3/uL (ref 4.0–10.5)
nRBC: 0 % (ref 0.0–0.2)

## 2023-10-18 LAB — BASIC METABOLIC PANEL WITH GFR
Anion gap: 13 (ref 5–15)
BUN: 27 mg/dL — ABNORMAL HIGH (ref 8–23)
CO2: 28 mmol/L (ref 22–32)
Calcium: 10.6 mg/dL — ABNORMAL HIGH (ref 8.9–10.3)
Chloride: 99 mmol/L (ref 98–111)
Creatinine, Ser: 1.27 mg/dL — ABNORMAL HIGH (ref 0.44–1.00)
GFR, Estimated: 45 mL/min — ABNORMAL LOW (ref >60)
Glucose, Bld: 89 mg/dL (ref 70–99)
Potassium: 4 mmol/L (ref 3.5–5.1)
Sodium: 139 mmol/L (ref 135–145)

## 2023-10-18 LAB — TROPONIN T, HIGH SENSITIVITY
Troponin T High Sensitivity: <15 ng/L (ref <19)
Troponin T High Sensitivity: <15 ng/L (ref <19)

## 2023-10-18 LAB — PRO BRAIN NATRIURETIC PEPTIDE: Pro Brain Natriuretic Peptide: 51.6 pg/mL (ref <300.0)

## 2023-10-18 MED ORDER — MORPHINE SULFATE (PF) 4 MG/ML IV SOLN
4.0000 mg | Freq: Once | INTRAVENOUS | Status: AC
  Administered 2023-10-18: 4 mg via INTRAVENOUS
  Filled 2023-10-18: qty 1

## 2023-10-18 MED ORDER — ASPIRIN 81 MG PO CHEW
324.0000 mg | CHEWABLE_TABLET | Freq: Once | ORAL | Status: AC
  Administered 2023-10-18: 324 mg via ORAL
  Filled 2023-10-18: qty 4

## 2023-10-18 MED ORDER — ALUM & MAG HYDROXIDE-SIMETH 200-200-20 MG/5ML PO SUSP
30.0000 mL | Freq: Once | ORAL | Status: AC
  Administered 2023-10-18: 30 mL via ORAL
  Filled 2023-10-18: qty 30

## 2023-10-18 MED ORDER — LIDOCAINE VISCOUS HCL 2 % MT SOLN
15.0000 mL | Freq: Once | OROMUCOSAL | Status: AC
  Administered 2023-10-18: 15 mL via ORAL
  Filled 2023-10-18: qty 15

## 2023-10-18 NOTE — ED Triage Notes (Signed)
 Pt POV steady gait with personal rolling walker- pt c/o chest pain all day, intermittent. Sometimes radiating to neck. Also reports ShOB. Reports new BLLE swelling.   Took tylenol  and tums appx 1400.

## 2023-10-18 NOTE — Discharge Instructions (Signed)
 You were seen in the emergency department for pain in your chest and upper abdomen.  You had lab work EKG and chest x-ray that did not show any evidence of a heart attack.  Please take your omeprazole 40 mg daily.  Can also use Maalox and Tums.  Contact your primary care doctor for close follow-up.  Return to the emergency department if any worsening or concerning symptoms.

## 2023-10-18 NOTE — ED Notes (Signed)
 2nd trop sent again

## 2023-10-18 NOTE — ED Provider Notes (Signed)
 [facility] Provider Note   CSN: [id number] Arrival date & time: 10/18/23  1851     History {Add pertinent medical, surgical, social history, OB history to HPI:1} Chief Complaint  Patient presents with   Chest Pain   Shortness of Breath    Ana English is a 73 y.o. female.  He is here with substernal chest pain severe pressure radiating up to her neck and under her breast that is been going on all day today.  Currently ongoing but never fully resolving.  Associated with some shortness of breath and swelling in her legs.  She has a history of A-fib on anticoagulation and heart failure on diuretics.  Currently taking 20 of torsemide  a day, sometimes 40 for extra swelling.  Follows with [facility] cardiology.  Recently went on a trip to the [place].  No fevers chills nausea vomiting.  The history is provided by the patient and a relative.  Chest Pain Pain location:  Substernal area Pain quality: aching and pressure   Pain radiates to:  Neck Pain severity:  Severe Onset quality:  Sudden Duration:  1 day Timing:  Intermittent Progression:  Waxing and waning Chronicity:  New Relieved by:  None tried Worsened by:  Nothing Ineffective treatments:  None tried Associated symptoms: shortness of breath   Associated symptoms: no abdominal pain, no cough, no diaphoresis, no fever, no nausea and no vomiting   Risk factors: high cholesterol and hypertension   Shortness of Breath Associated symptoms: chest pain   Associated symptoms: no abdominal pain, no cough, no diaphoresis, no fever and no vomiting        Home Medications Prior to Admission medications   Medication Sig Start Date End Date Taking? Authorizing Provider  acetaminophen  (TYLENOL ) 500 MG tablet Take 500 mg by mouth every 6 (six) hours as needed for mild pain or moderate pain.    [provider]  atorvastatin  (LIPITOR) 80 MG tablet Take 80 mg by mouth daily. 02/16/20    [provider]  calcium  carbonate (TUMS - DOSED IN MG ELEMENTAL CALCIUM ) 500 MG chewable tablet Chew 1 tablet by mouth daily as needed for indigestion or heartburn.     [provider]  CALCIUM -VITAMIN D  PO Take 1 tablet by mouth daily. Calcium  is 1000mg     [provider]  etodolac  (LODINE ) 400 MG tablet Take 400 mg by mouth daily as needed (Arthritis).     [provider]  ezetimibe (ZETIA) 10 MG tablet Take 10 mg by mouth daily.    [provider]  irbesartan  (AVAPRO ) 150 MG tablet Take 75 mg by mouth every morning.  Patient not taking: Reported on 11/18/2020 04/20/18   [provider]  levalbuterol  (XOPENEX  HFA) 45 MCG/ACT inhaler Inhale 2 puffs into the lungs every 4 (four) hours as needed for wheezing.    [provider]  metoprolol  succinate (TOPROL -XL) 25 MG 24 hr tablet Take 12.5 mg by mouth in the morning and at bedtime.    [provider]  nitroGLYCERIN  (NITROSTAT ) 0.4 MG SL tablet Place 1 tablet (0.4 mg total) under the tongue every 5 (five) minutes as needed for chest pain. 04/26/18   Mariea Shook, MD  omeprazole (PRILOSEC) 20 MG capsule Take 20 mg by mouth as needed.    [provider]  potassium chloride  (KLOR-CON ) 10 MEQ tablet Take 10 mEq by mouth daily. 01/09/20   [provider]  rivaroxaban  (XARELTO ) 20 MG TABS tablet Take 20  mg by mouth every morning.     [provider]  spironolactone  (ALDACTONE ) 25 MG tablet Take 25 mg by mouth daily. 10/06/19 10/22/21  [provider]  torsemide  (DEMADEX ) 20 MG tablet Take 20 mg by mouth in the morning and at bedtime. 01/08/20 10/22/21  [provider]  verapamil  (VERELAN  PM) 360 MG 24 hr capsule Take 300 mg by mouth every morning.     [provider]  vitamin B-12 (CYANOCOBALAMIN) 1000 MCG tablet Take 1,000 mcg by mouth daily.      [provider]      Allergies    Patient has no known allergies.    Review  of Systems   Review of Systems  Constitutional:  Negative for diaphoresis and fever.  Respiratory:  Positive for shortness of breath. Negative for cough.   Cardiovascular:  Positive for chest pain.  Gastrointestinal:  Negative for abdominal pain, nausea and vomiting.    Physical Exam Updated Vital Signs BP (!) 158/90   Pulse 87   Temp 98 F (36.7 C) (Oral)   Resp 14   Ht 5' 5.5" (1.664 m)   Wt 113.4 kg   SpO2 100%   BMI 40.97 kg/m  Physical Exam Vitals and nursing note reviewed.  Constitutional:      General: She is not in acute distress.    Appearance: Normal appearance. She is well-developed.  HENT:     Head: Normocephalic and atraumatic.  Eyes:     Conjunctiva/sclera: Conjunctivae normal.  Cardiovascular:     Rate and Rhythm: Normal rate and regular rhythm.     Heart sounds: Normal heart sounds. No murmur heard. Pulmonary:     Effort: Pulmonary effort is normal. No respiratory distress.     Breath sounds: Normal breath sounds. No stridor. No wheezing.  Abdominal:     Palpations: Abdomen is soft.     Tenderness: There is no abdominal tenderness. There is no guarding or rebound.  Musculoskeletal:        General: No tenderness or deformity. Normal range of motion.     Cervical back: Neck supple.     Right lower leg: No tenderness. Edema present.     Left lower leg: No tenderness. Edema present.  Skin:    General: Skin is warm and dry.  Neurological:     General: No focal deficit present.     Mental Status: She is alert.     GCS: GCS eye subscore is 4. GCS verbal subscore is 5. GCS motor subscore is 6.     ED Results / Procedures / Treatments   Labs (all labs ordered are listed, but only abnormal results are displayed) Labs Reviewed  BASIC METABOLIC PANEL WITH GFR  CBC  PRO BRAIN NATRIURETIC PEPTIDE  TROPONIN T, HIGH SENSITIVITY    EKG EKG Interpretation Date/Time:  Monday Oct 18 2023 19:08:26 EDT Ventricular Rate:  100 PR Interval:  158 QRS  Duration:  91 QT Interval:  340 QTC Calculation: 439 R Axis:   -4  Text Interpretation: Sinus tachycardia increased rate from prior 9/21 Confirmed by Racheal Buddle [phone]) on 10/18/2023 7:14:59 PM  Radiology No results found.  Procedures Procedures  {Document cardiac monitor, telemetry assessment procedure when appropriate:1}  Medications Ordered in ED Medications  aspirin chewable tablet 324 mg (has no administration in time range)  morphine (PF) 4 MG/ML injection 4 mg (has no administration in time range)    ED Course/ Medical Decision Making/ A&P   {  Click here for ABCD2, HEART and other calculatorsREFRESH Note before signing :1}                              Medical Decision Making Amount and/or Complexity of Data Reviewed Labs: ordered. Radiology: ordered.  Risk OTC drugs. Prescription drug management.   This patient complains of ***; this involves an extensive number of treatment Options and is a complaint that carries with it a high risk of complications and morbidity. The differential includes ***  I ordered, reviewed and interpreted labs, which included *** I ordered medication *** and reviewed PMP when indicated. I ordered imaging studies which included *** and I independently    visualized and interpreted imaging which showed *** Additional history obtained from *** Previous records obtained and reviewed *** I consulted *** and discussed lab and imaging findings and discussed disposition.  Cardiac monitoring reviewed, *** Social determinants considered, *** Critical Interventions: ***  After the interventions stated above, I reevaluated the patient and found *** Admission and further testing considered, ***   {Document critical care time when appropriate:1} {Document review of labs and clinical decision tools ie heart score, Chads2Vasc2 etc:1}  {Document your independent review of radiology images, and any outside records:1} {Document your  discussion with family members, caretakers, and with consultants:1} {Document social determinants of health affecting pt's care:1} {Document your decision making why or why not admission, treatments were needed:1} Final Clinical Impression(s) / ED Diagnoses Final diagnoses:  None    Rx / DC Orders ED Discharge Orders     None

## 2023-10-18 NOTE — ED Notes (Signed)
 Lab informed that label maker is not working in Haematologist. States ticket has already been put in

## 2023-10-18 NOTE — ED Notes (Signed)
 Pt's pain much improves after Gi cocktail

## 2023-10-18 NOTE — ED Notes (Signed)
 Pt requests to sit on side of bed describes epigastric pain up under both breasts Gi cocktail given  States it times the pain radiates thru her back Family at bedside and one dtg sitting in front of her at side of bed

## 2023-10-21 DIAGNOSIS — I5032 Chronic diastolic (congestive) heart failure: Secondary | ICD-10-CM | POA: Diagnosis not present

## 2023-10-21 DIAGNOSIS — N200 Calculus of kidney: Secondary | ICD-10-CM | POA: Diagnosis not present

## 2023-10-21 DIAGNOSIS — K219 Gastro-esophageal reflux disease without esophagitis: Secondary | ICD-10-CM | POA: Diagnosis not present

## 2023-10-21 DIAGNOSIS — R059 Cough, unspecified: Secondary | ICD-10-CM | POA: Diagnosis not present

## 2023-10-21 DIAGNOSIS — I10 Essential (primary) hypertension: Secondary | ICD-10-CM | POA: Diagnosis not present

## 2023-10-26 DIAGNOSIS — K429 Umbilical hernia without obstruction or gangrene: Secondary | ICD-10-CM | POA: Diagnosis not present

## 2023-10-26 DIAGNOSIS — R1010 Upper abdominal pain, unspecified: Secondary | ICD-10-CM | POA: Diagnosis not present

## 2023-10-26 DIAGNOSIS — Z9049 Acquired absence of other specified parts of digestive tract: Secondary | ICD-10-CM | POA: Diagnosis not present

## 2023-11-01 DIAGNOSIS — Z6841 Body Mass Index (BMI) 40.0 and over, adult: Secondary | ICD-10-CM | POA: Diagnosis not present

## 2023-11-01 DIAGNOSIS — Z299 Encounter for prophylactic measures, unspecified: Secondary | ICD-10-CM | POA: Diagnosis not present

## 2023-11-01 DIAGNOSIS — I4891 Unspecified atrial fibrillation: Secondary | ICD-10-CM | POA: Diagnosis not present

## 2023-11-01 DIAGNOSIS — R21 Rash and other nonspecific skin eruption: Secondary | ICD-10-CM | POA: Diagnosis not present

## 2023-11-01 DIAGNOSIS — I10 Essential (primary) hypertension: Secondary | ICD-10-CM | POA: Diagnosis not present

## 2023-11-01 DIAGNOSIS — I5032 Chronic diastolic (congestive) heart failure: Secondary | ICD-10-CM | POA: Diagnosis not present

## 2023-11-04 DIAGNOSIS — I5032 Chronic diastolic (congestive) heart failure: Secondary | ICD-10-CM | POA: Diagnosis not present

## 2023-11-04 DIAGNOSIS — K2100 Gastro-esophageal reflux disease with esophagitis, without bleeding: Secondary | ICD-10-CM | POA: Diagnosis not present

## 2023-11-04 DIAGNOSIS — I4891 Unspecified atrial fibrillation: Secondary | ICD-10-CM | POA: Diagnosis not present

## 2023-11-04 DIAGNOSIS — K219 Gastro-esophageal reflux disease without esophagitis: Secondary | ICD-10-CM | POA: Diagnosis not present

## 2023-11-04 DIAGNOSIS — Z7901 Long term (current) use of anticoagulants: Secondary | ICD-10-CM | POA: Diagnosis not present

## 2023-11-04 DIAGNOSIS — K449 Diaphragmatic hernia without obstruction or gangrene: Secondary | ICD-10-CM | POA: Diagnosis not present

## 2023-11-04 DIAGNOSIS — R079 Chest pain, unspecified: Secondary | ICD-10-CM | POA: Diagnosis not present

## 2023-11-04 DIAGNOSIS — E66813 Obesity, class 3: Secondary | ICD-10-CM | POA: Diagnosis not present

## 2023-11-04 DIAGNOSIS — Z6841 Body Mass Index (BMI) 40.0 and over, adult: Secondary | ICD-10-CM | POA: Diagnosis not present

## 2023-11-04 DIAGNOSIS — I110 Hypertensive heart disease with heart failure: Secondary | ICD-10-CM | POA: Diagnosis not present

## 2023-11-04 DIAGNOSIS — G4730 Sleep apnea, unspecified: Secondary | ICD-10-CM | POA: Diagnosis not present

## 2023-11-04 DIAGNOSIS — Z888 Allergy status to other drugs, medicaments and biological substances status: Secondary | ICD-10-CM | POA: Diagnosis not present

## 2023-11-09 DIAGNOSIS — L501 Idiopathic urticaria: Secondary | ICD-10-CM | POA: Diagnosis not present

## 2023-11-30 DIAGNOSIS — R079 Chest pain, unspecified: Secondary | ICD-10-CM | POA: Diagnosis not present

## 2023-11-30 DIAGNOSIS — E66813 Obesity, class 3: Secondary | ICD-10-CM | POA: Diagnosis not present

## 2023-11-30 DIAGNOSIS — I480 Paroxysmal atrial fibrillation: Secondary | ICD-10-CM | POA: Diagnosis not present

## 2023-11-30 DIAGNOSIS — I110 Hypertensive heart disease with heart failure: Secondary | ICD-10-CM | POA: Diagnosis not present

## 2023-11-30 DIAGNOSIS — Z6841 Body Mass Index (BMI) 40.0 and over, adult: Secondary | ICD-10-CM | POA: Diagnosis not present

## 2023-11-30 DIAGNOSIS — I5032 Chronic diastolic (congestive) heart failure: Secondary | ICD-10-CM | POA: Diagnosis not present

## 2023-12-10 ENCOUNTER — Ambulatory Visit: Admitting: Allergy & Immunology

## 2023-12-10 DIAGNOSIS — E782 Mixed hyperlipidemia: Secondary | ICD-10-CM | POA: Diagnosis not present

## 2023-12-10 DIAGNOSIS — I10 Essential (primary) hypertension: Secondary | ICD-10-CM | POA: Diagnosis not present

## 2023-12-10 DIAGNOSIS — E66813 Obesity, class 3: Secondary | ICD-10-CM | POA: Diagnosis not present

## 2023-12-10 DIAGNOSIS — R079 Chest pain, unspecified: Secondary | ICD-10-CM | POA: Diagnosis not present

## 2023-12-10 DIAGNOSIS — I110 Hypertensive heart disease with heart failure: Secondary | ICD-10-CM | POA: Diagnosis not present

## 2023-12-10 DIAGNOSIS — I25119 Atherosclerotic heart disease of native coronary artery with unspecified angina pectoris: Secondary | ICD-10-CM | POA: Diagnosis not present

## 2023-12-10 DIAGNOSIS — Z7901 Long term (current) use of anticoagulants: Secondary | ICD-10-CM | POA: Diagnosis not present

## 2023-12-10 DIAGNOSIS — E785 Hyperlipidemia, unspecified: Secondary | ICD-10-CM | POA: Diagnosis not present

## 2023-12-10 DIAGNOSIS — I471 Supraventricular tachycardia, unspecified: Secondary | ICD-10-CM | POA: Diagnosis not present

## 2023-12-10 DIAGNOSIS — I509 Heart failure, unspecified: Secondary | ICD-10-CM | POA: Diagnosis not present

## 2023-12-10 DIAGNOSIS — R0789 Other chest pain: Secondary | ICD-10-CM | POA: Diagnosis not present

## 2023-12-10 DIAGNOSIS — Z6841 Body Mass Index (BMI) 40.0 and over, adult: Secondary | ICD-10-CM | POA: Diagnosis not present

## 2023-12-10 DIAGNOSIS — I5032 Chronic diastolic (congestive) heart failure: Secondary | ICD-10-CM | POA: Diagnosis not present

## 2023-12-10 DIAGNOSIS — I480 Paroxysmal atrial fibrillation: Secondary | ICD-10-CM | POA: Diagnosis not present

## 2023-12-10 DIAGNOSIS — E669 Obesity, unspecified: Secondary | ICD-10-CM | POA: Diagnosis not present

## 2023-12-12 ENCOUNTER — Encounter (HOSPITAL_COMMUNITY): Payer: Self-pay

## 2023-12-12 ENCOUNTER — Other Ambulatory Visit: Payer: Self-pay

## 2023-12-12 ENCOUNTER — Emergency Department (HOSPITAL_COMMUNITY)
Admission: EM | Admit: 2023-12-12 | Discharge: 2023-12-12 | Disposition: A | Discharge status: Home / Self Care | Attending: Emergency Medicine | Admitting: Emergency Medicine

## 2023-12-12 ENCOUNTER — Emergency Department (HOSPITAL_COMMUNITY)

## 2023-12-12 DIAGNOSIS — I700 Atherosclerosis of aorta: Secondary | ICD-10-CM | POA: Diagnosis not present

## 2023-12-12 DIAGNOSIS — L509 Urticaria, unspecified: Secondary | ICD-10-CM | POA: Diagnosis present

## 2023-12-12 DIAGNOSIS — Z7901 Long term (current) use of anticoagulants: Secondary | ICD-10-CM | POA: Diagnosis not present

## 2023-12-12 DIAGNOSIS — R079 Chest pain, unspecified: Secondary | ICD-10-CM | POA: Diagnosis not present

## 2023-12-12 DIAGNOSIS — T7840XA Allergy, unspecified, initial encounter: Secondary | ICD-10-CM | POA: Insufficient documentation

## 2023-12-12 DIAGNOSIS — R0789 Other chest pain: Secondary | ICD-10-CM | POA: Diagnosis not present

## 2023-12-12 LAB — CBC
HCT: 44.6 % (ref 36.0–46.0)
Hemoglobin: 14.2 g/dL (ref 12.0–15.0)
MCH: 31.5 pg (ref 26.0–34.0)
MCHC: 31.8 g/dL (ref 30.0–36.0)
MCV: 98.9 fL (ref 80.0–100.0)
Platelets: 220 K/uL (ref 150–400)
RBC: 4.51 MIL/uL (ref 3.87–5.11)
RDW: 13.3 % (ref 11.5–15.5)
WBC: 6.9 K/uL (ref 4.0–10.5)
nRBC: 0 % (ref 0.0–0.2)

## 2023-12-12 LAB — TROPONIN I (HIGH SENSITIVITY)
Troponin I (High Sensitivity): 3 ng/L (ref <18)
Troponin I (High Sensitivity): 4 ng/L (ref <18)

## 2023-12-12 LAB — BASIC METABOLIC PANEL WITH GFR
Anion gap: 12 (ref 5–15)
BUN: 14 mg/dL (ref 8–23)
CO2: 22 mmol/L (ref 22–32)
Calcium: 8.6 mg/dL — ABNORMAL LOW (ref 8.9–10.3)
Chloride: 105 mmol/L (ref 98–111)
Creatinine, Ser: 0.9 mg/dL (ref 0.44–1.00)
GFR, Estimated: >60 mL/min (ref >60)
Glucose, Bld: 67 mg/dL — ABNORMAL LOW (ref 70–99)
Potassium: 4.2 mmol/L (ref 3.5–5.1)
Sodium: 139 mmol/L (ref 135–145)

## 2023-12-12 MED ORDER — PREDNISONE 10 MG PO TABS: 20.0000 mg | ORAL_TABLET | Freq: Every day | ORAL | 0 refills | Status: AC

## 2023-12-12 MED ORDER — METHYLPREDNISOLONE SODIUM SUCC 125 MG IJ SOLR
125.0000 mg | Freq: Once | INTRAMUSCULAR | Status: AC
  Administered 2023-12-12: 125 mg via INTRAMUSCULAR
  Filled 2023-12-12: qty 2

## 2023-12-12 MED ORDER — HYDROXYZINE HCL 50 MG/ML IM SOLN
50.0000 mg | Freq: Once | INTRAMUSCULAR | Status: AC
  Administered 2023-12-12: 50 mg via INTRAMUSCULAR
  Filled 2023-12-12: qty 1

## 2023-12-12 MED ORDER — HYDROXYZINE HCL 25 MG PO TABS: 25.0000 mg | ORAL_TABLET | Freq: Four times a day (QID) | ORAL | 3 refills | Status: AC | PRN

## 2023-12-12 MED ORDER — FAMOTIDINE 20 MG PO TABS
20.0000 mg | ORAL_TABLET | Freq: Once | ORAL | Status: AC
  Administered 2023-12-12: 20 mg via ORAL
  Filled 2023-12-12: qty 1

## 2023-12-12 NOTE — Discharge Instructions (Signed)
Follow-up with your family doctor this week 

## 2023-12-12 NOTE — ED Triage Notes (Signed)
 Patient reports she took 2 nitrostats about 1 hour ago with some relief; and benadryl  at home

## 2023-12-12 NOTE — ED Triage Notes (Signed)
 Patient from home for itchy hands and hives that started about a month ago, and center non radiating chest pain and sore tongue that started today. Unknown if she has any allergies yet; reports everything started when she started pantoprazole  so the medication was stopped but symptoms are still present 6 weeks later. Upon arrival to ER, patient is alert and oriented, ambu; appears anxious.

## 2023-12-14 DIAGNOSIS — I4891 Unspecified atrial fibrillation: Secondary | ICD-10-CM | POA: Diagnosis not present

## 2023-12-14 DIAGNOSIS — R768 Other specified abnormal immunological findings in serum: Secondary | ICD-10-CM | POA: Diagnosis not present

## 2023-12-14 DIAGNOSIS — M1711 Unilateral primary osteoarthritis, right knee: Secondary | ICD-10-CM | POA: Diagnosis not present

## 2023-12-14 DIAGNOSIS — M159 Polyosteoarthritis, unspecified: Secondary | ICD-10-CM | POA: Diagnosis not present

## 2023-12-14 DIAGNOSIS — I5032 Chronic diastolic (congestive) heart failure: Secondary | ICD-10-CM | POA: Diagnosis not present

## 2023-12-14 NOTE — ED Provider Notes (Signed)
 [place] EMERGENCY DEPARTMENT AT [facility] Provider Note   CSN: [id number] Arrival date & time: 12/12/23  8094     Patient presents with: multiple complaints   Ana English is a 73 y.o. female.   Patient complains of hives on her back and itching on her hands this has been going on for a number of days.   Allergic Reaction Presenting symptoms: itching and rash   Presenting symptoms: no difficulty breathing   Severity:  Moderate Prior allergic episodes:  Unable to specify Context: not animal exposure   Relieved by:  Nothing Worsened by:  Nothing Ineffective treatments:  None tried      Prior to Admission medications   Medication Sig Start Date End Date Taking? Authorizing Provider  hydrOXYzine  (ATARAX ) 25 MG tablet Take 1 tablet (25 mg total) by mouth every 6 (six) hours as needed for itching. 12/12/23  Yes Suzette Pac, MD  predniSONE  (DELTASONE ) 10 MG tablet Take 2 tablets (20 mg total) by mouth daily. 12/12/23  Yes Iyari Hagner, MD  acetaminophen  (TYLENOL ) 500 MG tablet Take 500 mg by mouth every 6 (six) hours as needed for mild pain or moderate pain.    [provider]  atorvastatin  (LIPITOR) 80 MG tablet Take 80 mg by mouth daily. 02/16/20   [provider]  calcium  carbonate (TUMS - DOSED IN MG ELEMENTAL CALCIUM ) 500 MG chewable tablet Chew 1 tablet by mouth daily as needed for indigestion or heartburn.     [provider]  CALCIUM -VITAMIN D  PO Take 1 tablet by mouth daily. Calcium  is 1000mg     [provider]  etodolac  (LODINE ) 400 MG tablet Take 400 mg by mouth daily as needed (Arthritis).     [provider]  ezetimibe (ZETIA) 10 MG tablet Take 10 mg by mouth daily.    [provider]  irbesartan  (AVAPRO ) 150 MG tablet Take 75 mg by mouth every morning.  Patient not taking: Reported on 11/18/2020 04/20/18   [provider]  levalbuterol  (XOPENEX  HFA) 45 MCG/ACT inhaler Inhale 2 puffs into  the lungs every 4 (four) hours as needed for wheezing.    [provider]  metoprolol  succinate (TOPROL -XL) 25 MG 24 hr tablet Take 12.5 mg by mouth in the morning and at bedtime.    [provider]  nitroGLYCERIN  (NITROSTAT ) 0.4 MG SL tablet Place 1 tablet (0.4 mg total) under the tongue every 5 (five) minutes as needed for chest pain. 04/26/18   Vonzell Loving, MD  omeprazole (PRILOSEC) 20 MG capsule Take 20 mg by mouth as needed.    [provider]  potassium chloride  (KLOR-CON ) 10 MEQ tablet Take 10 mEq by mouth daily. 01/09/20   [provider]  rivaroxaban  (XARELTO ) 20 MG TABS tablet Take 20 mg by mouth every morning.     [provider]  spironolactone  (ALDACTONE ) 25 MG tablet Take 25 mg by mouth daily. 10/06/19 10/22/21  [provider]  torsemide  (DEMADEX ) 20 MG tablet Take 20 mg by mouth in the morning and at bedtime. 01/08/20 10/22/21  [provider]  verapamil  (VERELAN  PM) 360 MG 24 hr capsule Take 300 mg by mouth every morning.     [provider]  vitamin B-12 (CYANOCOBALAMIN) 1000 MCG tablet Take 1,000 mcg by mouth daily.      [provider]    Allergies: Patient has no known allergies.    Review of Systems  Constitutional:  Negative for appetite change and fatigue.  HENT:  Negative for congestion, ear discharge and sinus pressure.   Eyes:  Negative for discharge.  Respiratory:  Negative for cough.   Cardiovascular:  Negative for chest pain.  Gastrointestinal:  Negative for abdominal pain and diarrhea.  Genitourinary:  Negative for frequency and hematuria.  Musculoskeletal:  Negative for back pain.  Skin:  Positive for itching and rash.  Neurological:  Negative for seizures and headaches.  Psychiatric/Behavioral:  Negative for hallucinations.     Updated Vital Signs BP (!) 149/91   Pulse 86   Temp 98.7 F (37.1 C) (Oral)   Resp 18   Ht 5' 6 (1.676 m)   Wt 123.8 kg   SpO2 98%   BMI 44.06  kg/m   Physical Exam Vitals and nursing note reviewed.  Constitutional:      Appearance: She is well-developed.  HENT:     Head: Normocephalic.     Nose: Nose normal.  Eyes:     General: No scleral icterus.    Conjunctiva/sclera: Conjunctivae normal.  Neck:     Thyroid : No thyromegaly.  Cardiovascular:     Rate and Rhythm: Normal rate and regular rhythm.     Heart sounds: No murmur heard.    No friction rub. No gallop.  Pulmonary:     Breath sounds: No stridor. No wheezing or rales.  Chest:     Chest wall: No tenderness.  Abdominal:     General: There is no distension.     Tenderness: There is no abdominal tenderness. There is no rebound.  Musculoskeletal:        General: Normal range of motion.     Cervical back: Neck supple.     Comments: Rash to hands and hives on the back  Lymphadenopathy:     Cervical: No cervical adenopathy.  Skin:    Findings: No erythema or rash.  Neurological:     Mental Status: She is alert and oriented to person, place, and time.     Motor: No abnormal muscle tone.     Coordination: Coordination normal.  Psychiatric:        Behavior: Behavior normal.     (all labs ordered are listed, but only abnormal results are displayed) Labs Reviewed  BASIC METABOLIC PANEL WITH GFR - Abnormal; Notable for the following components:      Result Value   Glucose, Bld 67 (*)    Calcium  8.6 (*)    All other components within normal limits  CBC  TROPONIN I (HIGH SENSITIVITY)  TROPONIN I (HIGH SENSITIVITY)    EKG: EKG Interpretation Date/Time:  Sunday December 12 2023 19:15:51 EDT Ventricular Rate:  92 PR Interval:  168 QRS Duration:  82 QT Interval:  338 QTC Calculation: 417 R Axis:   -6  Text Interpretation: Normal sinus rhythm Normal ECG When compared with ECG of 18-Oct-2023 19:08, PREVIOUS ECG IS PRESENT Confirmed by Suzette Pac [phone]) on 12/12/2023 9:11:39 PM  Radiology: [facility] Chest 2 View Result Date: 12/12/2023 CLINICAL DATA:  chest pain  EXAM: CHEST - 2 VIEW COMPARISON:  Chest x-ray 10/18/2023 FINDINGS: The heart and mediastinal contours are unchanged. Atherosclerotic plaque. No focal consolidation. No pulmonary edema. No pleural effusion. No pneumothorax. No acute osseous abnormality. IMPRESSION: 1. No active cardiopulmonary disease. 2.  Aortic Atherosclerosis (ICD10-I70.0). Electronically Signed   By: Morgane  Naveau M.D.   On: 12/12/2023 19:58     Procedures   Medications Ordered in the ED  hydrOXYzine  (VISTARIL ) injection 50 mg (50 mg Intramuscular Given 12/12/23 2143)  famotidine  (PEPCID ) tablet 20 mg (20 mg Oral Given 12/12/23 2142)  methylPREDNISolone  sodium succinate (SOLU-MEDROL ) 125 mg/2 mL injection 125 mg (125 mg Intramuscular Given 12/12/23 2143)                                    Medical Decision Making Amount and/or Complexity of Data Reviewed Labs: ordered. Radiology: ordered.  Risk Prescription drug management.   Patient with allergic reaction.  She is already taking antihistamine and Pepcid .  Will start her on prednisone  and give her some hydroxyzine  for itching.  She will follow-up with her PCP.  Patient's glucose was slightly low because she had not been eating.  She was given some crackers and felt fine     Final diagnoses:  Allergic reaction, initial encounter    ED Discharge Orders          Ordered    hydrOXYzine  (ATARAX ) 25 MG tablet  Every 6 hours PRN        12/12/23 2306    predniSONE  (DELTASONE ) 10 MG tablet  Daily        12/12/23 2306               Suzette Pac, MD 12/14/23 1333

## 2023-12-21 DIAGNOSIS — L501 Idiopathic urticaria: Secondary | ICD-10-CM | POA: Diagnosis not present

## 2023-12-28 DIAGNOSIS — J302 Other seasonal allergic rhinitis: Secondary | ICD-10-CM | POA: Diagnosis not present

## 2023-12-28 DIAGNOSIS — L508 Other urticaria: Secondary | ICD-10-CM | POA: Diagnosis not present

## 2023-12-28 DIAGNOSIS — J4520 Mild intermittent asthma, uncomplicated: Secondary | ICD-10-CM | POA: Diagnosis not present

## 2024-01-13 DIAGNOSIS — R0789 Other chest pain: Secondary | ICD-10-CM | POA: Diagnosis not present

## 2024-01-18 DIAGNOSIS — R5383 Other fatigue: Secondary | ICD-10-CM | POA: Diagnosis not present

## 2024-01-18 DIAGNOSIS — M1991 Primary osteoarthritis, unspecified site: Secondary | ICD-10-CM | POA: Diagnosis not present

## 2024-01-18 DIAGNOSIS — M25561 Pain in right knee: Secondary | ICD-10-CM | POA: Diagnosis not present

## 2024-01-18 DIAGNOSIS — Z6841 Body Mass Index (BMI) 40.0 and over, adult: Secondary | ICD-10-CM | POA: Diagnosis not present

## 2024-01-18 DIAGNOSIS — R768 Other specified abnormal immunological findings in serum: Secondary | ICD-10-CM | POA: Diagnosis not present

## 2024-01-18 DIAGNOSIS — L508 Other urticaria: Secondary | ICD-10-CM | POA: Diagnosis not present

## 2024-01-18 DIAGNOSIS — M25562 Pain in left knee: Secondary | ICD-10-CM | POA: Diagnosis not present

## 2024-01-20 DIAGNOSIS — Z1331 Encounter for screening for depression: Secondary | ICD-10-CM | POA: Diagnosis not present

## 2024-01-20 DIAGNOSIS — Z0000 Encounter for general adult medical examination without abnormal findings: Secondary | ICD-10-CM | POA: Diagnosis not present

## 2024-01-20 DIAGNOSIS — R52 Pain, unspecified: Secondary | ICD-10-CM | POA: Diagnosis not present

## 2024-01-20 DIAGNOSIS — E7800 Pure hypercholesterolemia, unspecified: Secondary | ICD-10-CM | POA: Diagnosis not present

## 2024-01-20 DIAGNOSIS — Z7189 Other specified counseling: Secondary | ICD-10-CM | POA: Diagnosis not present

## 2024-01-20 DIAGNOSIS — Z6841 Body Mass Index (BMI) 40.0 and over, adult: Secondary | ICD-10-CM | POA: Diagnosis not present

## 2024-01-20 DIAGNOSIS — I10 Essential (primary) hypertension: Secondary | ICD-10-CM | POA: Diagnosis not present

## 2024-01-20 DIAGNOSIS — Z1339 Encounter for screening examination for other mental health and behavioral disorders: Secondary | ICD-10-CM | POA: Diagnosis not present

## 2024-01-20 DIAGNOSIS — Z79899 Other long term (current) drug therapy: Secondary | ICD-10-CM | POA: Diagnosis not present

## 2024-01-20 DIAGNOSIS — Z299 Encounter for prophylactic measures, unspecified: Secondary | ICD-10-CM | POA: Diagnosis not present

## 2024-01-31 ENCOUNTER — Other Ambulatory Visit: Payer: Self-pay | Admitting: Medical Genetics

## 2024-02-02 ENCOUNTER — Other Ambulatory Visit (HOSPITAL_COMMUNITY)
Admission: RE | Admit: 2024-02-02 | Discharge: 2024-02-02 | Disposition: A | Discharge status: Home / Self Care | Payer: Self-pay | Source: Ambulatory Visit | Attending: Medical Genetics | Admitting: Medical Genetics

## 2024-02-10 DIAGNOSIS — R7989 Other specified abnormal findings of blood chemistry: Secondary | ICD-10-CM | POA: Diagnosis not present

## 2024-02-11 LAB — GENECONNECT MOLECULAR SCREEN: Genetic Analysis Overall Interpretation: NEGATIVE

## 2024-02-16 DIAGNOSIS — R5383 Other fatigue: Secondary | ICD-10-CM | POA: Diagnosis not present

## 2024-02-16 DIAGNOSIS — Z6841 Body Mass Index (BMI) 40.0 and over, adult: Secondary | ICD-10-CM | POA: Diagnosis not present

## 2024-02-16 DIAGNOSIS — L508 Other urticaria: Secondary | ICD-10-CM | POA: Diagnosis not present

## 2024-02-16 DIAGNOSIS — M1991 Primary osteoarthritis, unspecified site: Secondary | ICD-10-CM | POA: Diagnosis not present

## 2024-02-16 DIAGNOSIS — R768 Other specified abnormal immunological findings in serum: Secondary | ICD-10-CM | POA: Diagnosis not present

## 2024-02-22 DIAGNOSIS — E2839 Other primary ovarian failure: Secondary | ICD-10-CM | POA: Diagnosis not present

## 2024-02-22 DIAGNOSIS — Z23 Encounter for immunization: Secondary | ICD-10-CM | POA: Diagnosis not present

## 2024-03-27 DIAGNOSIS — J4520 Mild intermittent asthma, uncomplicated: Secondary | ICD-10-CM | POA: Diagnosis not present

## 2024-03-27 DIAGNOSIS — J302 Other seasonal allergic rhinitis: Secondary | ICD-10-CM | POA: Diagnosis not present

## 2024-03-27 DIAGNOSIS — L508 Other urticaria: Secondary | ICD-10-CM | POA: Diagnosis not present

## 2024-04-10 DIAGNOSIS — I110 Hypertensive heart disease with heart failure: Secondary | ICD-10-CM | POA: Diagnosis not present

## 2024-04-10 DIAGNOSIS — I480 Paroxysmal atrial fibrillation: Secondary | ICD-10-CM | POA: Diagnosis not present

## 2024-04-10 DIAGNOSIS — E66813 Obesity, class 3: Secondary | ICD-10-CM | POA: Diagnosis not present

## 2024-04-10 DIAGNOSIS — R7303 Prediabetes: Secondary | ICD-10-CM | POA: Diagnosis not present

## 2024-04-10 DIAGNOSIS — I5032 Chronic diastolic (congestive) heart failure: Secondary | ICD-10-CM | POA: Diagnosis not present

## 2024-04-10 DIAGNOSIS — E039 Hypothyroidism, unspecified: Secondary | ICD-10-CM | POA: Diagnosis not present

## 2024-04-10 DIAGNOSIS — E782 Mixed hyperlipidemia: Secondary | ICD-10-CM | POA: Diagnosis not present

## 2024-04-10 DIAGNOSIS — Z6841 Body Mass Index (BMI) 40.0 and over, adult: Secondary | ICD-10-CM | POA: Diagnosis not present

## 2024-04-11 DIAGNOSIS — L501 Idiopathic urticaria: Secondary | ICD-10-CM | POA: Diagnosis not present

## 2024-04-12 DIAGNOSIS — D485 Neoplasm of uncertain behavior of skin: Secondary | ICD-10-CM | POA: Diagnosis not present

## 2024-04-12 DIAGNOSIS — Z23 Encounter for immunization: Secondary | ICD-10-CM | POA: Diagnosis not present

## 2024-04-12 DIAGNOSIS — B079 Viral wart, unspecified: Secondary | ICD-10-CM | POA: Diagnosis not present

## 2024-04-12 DIAGNOSIS — L089 Local infection of the skin and subcutaneous tissue, unspecified: Secondary | ICD-10-CM | POA: Diagnosis not present

## 2024-04-18 DIAGNOSIS — M1991 Primary osteoarthritis, unspecified site: Secondary | ICD-10-CM | POA: Diagnosis not present

## 2024-04-18 DIAGNOSIS — R5383 Other fatigue: Secondary | ICD-10-CM | POA: Diagnosis not present

## 2024-04-18 DIAGNOSIS — Z6841 Body Mass Index (BMI) 40.0 and over, adult: Secondary | ICD-10-CM | POA: Diagnosis not present

## 2024-04-18 DIAGNOSIS — R7689 Other specified abnormal immunological findings in serum: Secondary | ICD-10-CM | POA: Diagnosis not present

## 2024-04-18 DIAGNOSIS — L508 Other urticaria: Secondary | ICD-10-CM | POA: Diagnosis not present

## 2024-05-09 DIAGNOSIS — L501 Idiopathic urticaria: Secondary | ICD-10-CM | POA: Diagnosis not present
